# Patient Record
Sex: Male | Born: 1960
Health system: Southern US, Community
[De-identification: ages and names within clinical notes are randomized; demographics above are authoritative.]

## PROBLEM LIST (undated history)

## (undated) DIAGNOSIS — K565 Intestinal adhesions [bands], unspecified as to partial versus complete obstruction: Secondary | ICD-10-CM

## (undated) DIAGNOSIS — I10 Essential (primary) hypertension: Secondary | ICD-10-CM

## (undated) HISTORY — PX: HERNIA REPAIR: SHX51

## (undated) HISTORY — DX: Intestinal adhesions (bands), unspecified as to partial versus complete obstruction: K56.50

---

## 2006-04-26 ENCOUNTER — Encounter: Admission: RE | Admit: 2006-04-26 | Discharge: 2006-04-26 | Payer: Self-pay | Admitting: Surgery

## 2006-04-28 ENCOUNTER — Ambulatory Visit (HOSPITAL_BASED_OUTPATIENT_CLINIC_OR_DEPARTMENT_OTHER): Admission: RE | Admit: 2006-04-28 | Discharge: 2006-04-28 | Payer: Self-pay | Admitting: Surgery

## 2006-05-05 ENCOUNTER — Encounter: Admission: RE | Admit: 2006-05-05 | Discharge: 2006-05-05 | Payer: Self-pay | Admitting: Surgery

## 2011-12-08 ENCOUNTER — Encounter: Payer: Self-pay | Attending: "Endocrinology | Admitting: *Deleted

## 2011-12-08 DIAGNOSIS — Z713 Dietary counseling and surveillance: Secondary | ICD-10-CM

## 2011-12-14 NOTE — Progress Notes (Signed)
Patient attended the [facility]: Hypertension/High Cholesterol nutrition class on 12/08/11.  Topics covered include:   1. Complications of Hyperlipidemia and/or Hypertension. 2. Ways to reduce risk of heart disease.  3. Identifying fat and sodium content on food labels. 4. Ways to decrease sodium intake. 5. Optimal amount of daily saturated fat intake. 6. Optimal amount of daily sodium intake.  7. Foods to limit/avoid on a heart healthy diet.  Patient to follow-up with [facility] prn.  

## 2015-09-12 MED FILL — BYSTOLIC 5 MG TABLET: 5 | 60 days supply | Qty: 60 | Fill #2

## 2015-12-01 MED FILL — ROSUVASTATIN CALCIUM 10 MG: 10 | 30 days supply | Qty: 30 | Fill #1

## 2015-12-01 MED FILL — BYSTOLIC 5 MG TABLET: 5 | 90 days supply | Qty: 90 | Fill #0

## 2016-04-07 DIAGNOSIS — H52203 Unspecified astigmatism, bilateral: Secondary | ICD-10-CM | POA: Diagnosis not present

## 2016-09-13 DIAGNOSIS — Z79899 Other long term (current) drug therapy: Secondary | ICD-10-CM | POA: Diagnosis not present

## 2016-09-13 DIAGNOSIS — Z6837 Body mass index (BMI) 37.0-37.9, adult: Secondary | ICD-10-CM | POA: Diagnosis not present

## 2016-09-13 DIAGNOSIS — E785 Hyperlipidemia, unspecified: Secondary | ICD-10-CM | POA: Diagnosis not present

## 2016-09-13 DIAGNOSIS — J302 Other seasonal allergic rhinitis: Secondary | ICD-10-CM | POA: Diagnosis not present

## 2016-09-13 DIAGNOSIS — Z125 Encounter for screening for malignant neoplasm of prostate: Secondary | ICD-10-CM | POA: Diagnosis not present

## 2016-09-13 DIAGNOSIS — I10 Essential (primary) hypertension: Secondary | ICD-10-CM | POA: Diagnosis not present

## 2016-09-13 MED FILL — BYSTOLIC 5 MG TABLET: 5 | 90 days supply | Qty: 90 | Fill #0

## 2016-09-14 MED FILL — ROSUVASTATIN CALCIUM 40 MG: 40 | 90 days supply | Qty: 90 | Fill #0

## 2017-03-15 DIAGNOSIS — Z6837 Body mass index (BMI) 37.0-37.9, adult: Secondary | ICD-10-CM | POA: Diagnosis not present

## 2017-03-15 DIAGNOSIS — Z131 Encounter for screening for diabetes mellitus: Secondary | ICD-10-CM | POA: Diagnosis not present

## 2017-03-15 DIAGNOSIS — Z1329 Encounter for screening for other suspected endocrine disorder: Secondary | ICD-10-CM | POA: Diagnosis not present

## 2017-03-15 DIAGNOSIS — I10 Essential (primary) hypertension: Secondary | ICD-10-CM | POA: Diagnosis not present

## 2017-03-15 DIAGNOSIS — Z125 Encounter for screening for malignant neoplasm of prostate: Secondary | ICD-10-CM | POA: Diagnosis not present

## 2017-03-15 DIAGNOSIS — Z1331 Encounter for screening for depression: Secondary | ICD-10-CM | POA: Diagnosis not present

## 2017-03-15 DIAGNOSIS — Z0000 Encounter for general adult medical examination without abnormal findings: Secondary | ICD-10-CM | POA: Diagnosis not present

## 2017-03-15 DIAGNOSIS — E785 Hyperlipidemia, unspecified: Secondary | ICD-10-CM | POA: Diagnosis not present

## 2017-03-15 DIAGNOSIS — Z79899 Other long term (current) drug therapy: Secondary | ICD-10-CM | POA: Diagnosis not present

## 2017-03-15 MED FILL — ROSUVASTATIN CALCIUM 40 MG: 40 | 90 days supply | Qty: 90 | Fill #0

## 2017-03-21 MED FILL — BYSTOLIC 5 MG TABLET: 5 | 90 days supply | Qty: 90 | Fill #0

## 2017-03-22 MED FILL — COMBIVENT RESPIMAT INHAL SP: 20-100 | 30 days supply | Qty: 4 | Fill #0

## 2017-11-10 ENCOUNTER — Ambulatory Visit (HOSPITAL_COMMUNITY)
Admission: RE | Admit: 2017-11-10 | Discharge: 2017-11-10 | Disposition: A | Discharge status: Home / Self Care | Payer: 59 | Source: Ambulatory Visit | Attending: Internal Medicine | Admitting: Internal Medicine

## 2017-11-10 ENCOUNTER — Encounter (HOSPITAL_COMMUNITY): Payer: Self-pay

## 2017-11-10 ENCOUNTER — Other Ambulatory Visit (HOSPITAL_COMMUNITY): Payer: Self-pay | Admitting: Internal Medicine

## 2017-11-10 ENCOUNTER — Other Ambulatory Visit (HOSPITAL_COMMUNITY): Payer: Self-pay | Admitting: Respiratory Therapy

## 2017-11-10 DIAGNOSIS — J45909 Unspecified asthma, uncomplicated: Secondary | ICD-10-CM | POA: Diagnosis not present

## 2017-11-10 DIAGNOSIS — K760 Fatty (change of) liver, not elsewhere classified: Secondary | ICD-10-CM | POA: Insufficient documentation

## 2017-11-10 DIAGNOSIS — Z6836 Body mass index (BMI) 36.0-36.9, adult: Secondary | ICD-10-CM | POA: Diagnosis not present

## 2017-11-10 DIAGNOSIS — K769 Liver disease, unspecified: Secondary | ICD-10-CM

## 2017-11-10 DIAGNOSIS — K56600 Partial intestinal obstruction, unspecified as to cause: Secondary | ICD-10-CM

## 2017-11-10 DIAGNOSIS — K5900 Constipation, unspecified: Secondary | ICD-10-CM

## 2017-11-10 DIAGNOSIS — K56609 Unspecified intestinal obstruction, unspecified as to partial versus complete obstruction: Secondary | ICD-10-CM

## 2017-11-10 DIAGNOSIS — R109 Unspecified abdominal pain: Secondary | ICD-10-CM

## 2017-11-10 DIAGNOSIS — I10 Essential (primary) hypertension: Secondary | ICD-10-CM | POA: Diagnosis not present

## 2017-11-10 DIAGNOSIS — Z79899 Other long term (current) drug therapy: Secondary | ICD-10-CM

## 2017-11-10 DIAGNOSIS — K56699 Other intestinal obstruction unspecified as to partial versus complete obstruction: Secondary | ICD-10-CM | POA: Diagnosis not present

## 2017-11-10 DIAGNOSIS — E785 Hyperlipidemia, unspecified: Secondary | ICD-10-CM | POA: Diagnosis not present

## 2017-11-10 DIAGNOSIS — I700 Atherosclerosis of aorta: Secondary | ICD-10-CM | POA: Insufficient documentation

## 2017-11-10 DIAGNOSIS — R112 Nausea with vomiting, unspecified: Secondary | ICD-10-CM | POA: Diagnosis not present

## 2017-11-10 DIAGNOSIS — K567 Ileus, unspecified: Secondary | ICD-10-CM | POA: Diagnosis not present

## 2017-11-10 MED ORDER — IOPAMIDOL (ISOVUE-300) INJECTION 61%
INTRAVENOUS | Status: AC
  Administered 2017-11-10: 100 mL
  Filled 2017-11-10: qty 30

## 2017-11-11 ENCOUNTER — Ambulatory Visit (HOSPITAL_COMMUNITY): Payer: 59

## 2017-11-12 ENCOUNTER — Inpatient Hospital Stay (HOSPITAL_COMMUNITY): Payer: 59

## 2017-11-12 ENCOUNTER — Ambulatory Visit (HOSPITAL_COMMUNITY)
Admission: RE | Admit: 2017-11-12 | Discharge: 2017-11-12 | Disposition: A | Discharge status: Home / Self Care | Payer: 59 | Source: Ambulatory Visit | Attending: Internal Medicine | Admitting: Internal Medicine

## 2017-11-12 ENCOUNTER — Other Ambulatory Visit: Payer: Self-pay

## 2017-11-12 ENCOUNTER — Encounter (HOSPITAL_COMMUNITY): Payer: Self-pay | Admitting: Emergency Medicine

## 2017-11-12 ENCOUNTER — Inpatient Hospital Stay (HOSPITAL_COMMUNITY)
Admission: EM | Admit: 2017-11-12 | Discharge: 2017-11-15 | DRG: 390 | Disposition: A | Discharge status: Home / Self Care | Payer: 59 | Attending: General Surgery | Admitting: General Surgery

## 2017-11-12 DIAGNOSIS — K56609 Unspecified intestinal obstruction, unspecified as to partial versus complete obstruction: Secondary | ICD-10-CM | POA: Diagnosis not present

## 2017-11-12 DIAGNOSIS — K769 Liver disease, unspecified: Secondary | ICD-10-CM

## 2017-11-12 DIAGNOSIS — I10 Essential (primary) hypertension: Secondary | ICD-10-CM | POA: Diagnosis present

## 2017-11-12 DIAGNOSIS — Z0189 Encounter for other specified special examinations: Secondary | ICD-10-CM

## 2017-11-12 DIAGNOSIS — Z4682 Encounter for fitting and adjustment of non-vascular catheter: Secondary | ICD-10-CM | POA: Diagnosis not present

## 2017-11-12 DIAGNOSIS — K760 Fatty (change of) liver, not elsewhere classified: Secondary | ICD-10-CM

## 2017-11-12 DIAGNOSIS — R112 Nausea with vomiting, unspecified: Secondary | ICD-10-CM | POA: Diagnosis not present

## 2017-11-12 DIAGNOSIS — K5651 Intestinal adhesions [bands], with partial obstruction: Secondary | ICD-10-CM | POA: Diagnosis not present

## 2017-11-12 DIAGNOSIS — Z79899 Other long term (current) drug therapy: Secondary | ICD-10-CM | POA: Diagnosis not present

## 2017-11-12 DIAGNOSIS — K565 Intestinal adhesions [bands], unspecified as to partial versus complete obstruction: Secondary | ICD-10-CM

## 2017-11-12 DIAGNOSIS — K56699 Other intestinal obstruction unspecified as to partial versus complete obstruction: Secondary | ICD-10-CM | POA: Diagnosis not present

## 2017-11-12 DIAGNOSIS — K56600 Partial intestinal obstruction, unspecified as to cause: Secondary | ICD-10-CM | POA: Diagnosis present

## 2017-11-12 HISTORY — DX: Essential (primary) hypertension: I10

## 2017-11-12 HISTORY — DX: Intestinal adhesions (bands), unspecified as to partial versus complete obstruction: K56.50

## 2017-11-12 LAB — COMPREHENSIVE METABOLIC PANEL
ALT: 29 U/L (ref 0–44)
AST: 28 U/L (ref 15–41)
Albumin: 4 g/dL (ref 3.5–5.0)
Alkaline Phosphatase: 68 U/L (ref 38–126)
Anion gap: 12 (ref 5–15)
BUN: 13 mg/dL (ref 6–20)
CHLORIDE: 98 mmol/L (ref 98–111)
CO2: 23 mmol/L (ref 22–32)
CREATININE: 1.12 mg/dL (ref 0.61–1.24)
Calcium: 9.2 mg/dL (ref 8.9–10.3)
GFR calc non Af Amer: >60 mL/min (ref >60)
Glucose, Bld: 111 mg/dL — ABNORMAL HIGH (ref 70–99)
Potassium: 3.8 mmol/L (ref 3.5–5.1)
Sodium: 133 mmol/L — ABNORMAL LOW (ref 135–145)
Total Bilirubin: 1.4 mg/dL — ABNORMAL HIGH (ref 0.3–1.2)
Total Protein: 7.1 g/dL (ref 6.5–8.1)

## 2017-11-12 LAB — CBC
HCT: 44.1 % (ref 39.0–52.0)
Hemoglobin: 14.7 g/dL (ref 13.0–17.0)
MCH: 29.6 pg (ref 26.0–34.0)
MCHC: 33.3 g/dL (ref 30.0–36.0)
MCV: 88.9 fL (ref 78.0–100.0)
PLATELETS: 260 10*3/uL (ref 150–400)
RBC: 4.96 MIL/uL (ref 4.22–5.81)
RDW: 11.9 % (ref 11.5–15.5)
WBC: 5.4 10*3/uL (ref 4.0–10.5)

## 2017-11-12 LAB — LIPASE, BLOOD: LIPASE: 23 U/L (ref 11–51)

## 2017-11-12 MED ORDER — ACETAMINOPHEN 650 MG RE SUPP: 650.0000 mg | Freq: Four times a day (QID) | RECTAL | Status: DC | PRN

## 2017-11-12 MED ORDER — GADOBENATE DIMEGLUMINE 529 MG/ML IV SOLN
20.0000 mL | Freq: Once | INTRAVENOUS | Status: AC
  Administered 2017-11-12: 20 mL via INTRAVENOUS

## 2017-11-12 MED ORDER — ONDANSETRON 4 MG PO TBDP
4.0000 mg | ORAL_TABLET | Freq: Four times a day (QID) | ORAL | Status: DC | PRN
Start: 2017-11-12 — End: 2017-11-15

## 2017-11-12 MED ORDER — MORPHINE SULFATE (PF) 2 MG/ML IV SOLN
2.0000 mg | INTRAVENOUS | Status: DC | PRN
  Administered 2017-11-12 – 2017-11-14 (×7): 2 mg via INTRAVENOUS
  Filled 2017-11-12 (×7): qty 1

## 2017-11-12 MED ORDER — ENOXAPARIN SODIUM 40 MG/0.4ML ~~LOC~~ SOLN
40.0000 mg | SUBCUTANEOUS | Status: DC
  Administered 2017-11-12 – 2017-11-14 (×3): 40 mg via SUBCUTANEOUS
  Filled 2017-11-12 (×3): qty 0.4

## 2017-11-12 MED ORDER — DIPHENHYDRAMINE HCL 50 MG/ML IJ SOLN: 12.5000 mg | Freq: Four times a day (QID) | INTRAMUSCULAR | Status: DC | PRN

## 2017-11-12 MED ORDER — METOPROLOL TARTRATE 5 MG/5ML IV SOLN: 5.0000 mg | Freq: Four times a day (QID) | INTRAVENOUS | Status: DC | PRN

## 2017-11-12 MED ORDER — ALUM & MAG HYDROXIDE-SIMETH 200-200-20 MG/5ML PO SUSP
15.0000 mL | Freq: Four times a day (QID) | ORAL | Status: DC | PRN
  Administered 2017-11-12 – 2017-11-14 (×2): 15 mL via ORAL
  Filled 2017-11-12 (×2): qty 30

## 2017-11-12 MED ORDER — SODIUM CHLORIDE 0.9 % IV SOLN
INTRAVENOUS | Status: DC
  Administered 2017-11-12 – 2017-11-15 (×5): via INTRAVENOUS

## 2017-11-12 MED ORDER — ONDANSETRON HCL 4 MG/2ML IJ SOLN
4.0000 mg | Freq: Four times a day (QID) | INTRAMUSCULAR | Status: DC | PRN
  Administered 2017-11-12: 4 mg via INTRAVENOUS
  Filled 2017-11-12: qty 2

## 2017-11-12 MED ORDER — DIPHENHYDRAMINE HCL 12.5 MG/5ML PO ELIX: 12.5000 mg | ORAL_SOLUTION | Freq: Four times a day (QID) | ORAL | Status: DC | PRN

## 2017-11-12 MED ORDER — ACETAMINOPHEN 325 MG PO TABS: 650.0000 mg | ORAL_TABLET | Freq: Four times a day (QID) | ORAL | Status: DC | PRN

## 2017-11-12 MED ORDER — DIATRIZOATE MEGLUMINE & SODIUM 66-10 % PO SOLN
90.0000 mL | Freq: Once | ORAL | Status: AC
  Administered 2017-11-12: 90 mL via NASOGASTRIC
  Filled 2017-11-12: qty 90

## 2017-11-12 NOTE — ED Provider Notes (Signed)
[facility] EMERGENCY DEPARTMENT Provider Note   CSN: [id number] Arrival date & time: 11/12/17  1057     History   Chief Complaint Chief Complaint  Patient presents with  . Small Bowel Obstruction    HPI Edward Meadows is a 57 y.o. male.  HPI Patient presents to the emergency department with right-sided abdominal pain that started on Sunday evening and progressively got worse over the next few days.  The patient states that he was seen by his primary care doctor on Wednesday for the symptoms.  He stated he been having nausea diarrhea and vomiting associated with this as well.  The patient thought he had a GI bug and that is what caused his symptoms.  Patient states his doctor did order blood work along with a CT scan and ultrasound.  The patient states that nothing seemed to make his condition better but certain movements and palpation made the pain worse.  Patient states that he did not take any medications prior to arrival for his symptoms.  The patient denies chest pain, shortness of breath, headache,blurred vision, neck pain, fever, cough, weakness, numbness, dizziness, anorexia, edema,  rash, back pain, dysuria, hematemesis, bloody stool, near syncope, or syncope. Past Medical History:  Diagnosis Date  . Hypertension     Patient Active Problem List   Diagnosis Date Noted  . SBO (small bowel obstruction) (HCC) 11/12/2017    Past Surgical History:  Procedure Laterality Date  . HERNIA REPAIR          Home Medications    Prior to Admission medications   Medication Sig Start Date End Date Taking? Authorizing Provider  BYSTOLIC 5 MG tablet Take 5 mg by mouth daily. 11/10/17  Yes [provider]  COMBIVENT RESPIMAT 20-100 MCG/ACT AERS respimat Take 1 puff by mouth every 6 (six) hours as needed for wheezing or shortness of breath. 11/10/17  Yes [provider]  rosuvastatin (CRESTOR) 40 MG tablet Take 40 mg by mouth daily. 11/10/17  Yes  [provider]    Family History History reviewed. No pertinent family history.  Social History Social History   Tobacco Use  . Smoking status: Never Smoker  . Smokeless tobacco: Never Used  Substance Use Topics  . Alcohol use: Not Currently  . Drug use: Never     Allergies   Patient has no known allergies.   Review of Systems Review of Systems  All other systems negative except as documented in the HPI. All pertinent positives and negatives as reviewed in the HPI. Physical Exam Updated Vital Signs BP 120/71   Pulse 79   Temp 97.7 F (36.5 C) (Oral)   Resp 14   SpO2 99%   Physical Exam  Constitutional: He is oriented to person, place, and time. He appears well-developed and well-nourished. No distress.  HENT:  Head: Normocephalic and atraumatic.  Mouth/Throat: Oropharynx is clear and moist.  Eyes: Pupils are equal, round, and reactive to light.  Neck: Normal range of motion. Neck supple.  Cardiovascular: Normal rate, regular rhythm and normal heart sounds. Exam reveals no gallop and no friction rub.  No murmur heard. Pulmonary/Chest: Effort normal and breath sounds normal. No respiratory distress. He has no wheezes.  Abdominal: Soft. Bowel sounds are normal. He exhibits no distension and no mass. There is tenderness. There is guarding. There is no rebound.    Neurological: He is alert and oriented to person, place, and time. He exhibits normal muscle tone. Coordination normal.  Skin: Skin is warm and dry. Capillary refill takes less than 2 seconds. No rash noted. No erythema.  Psychiatric: He has a normal mood and affect. His behavior is normal.  Nursing note and vitals reviewed.    ED Treatments / Results  Labs (all labs ordered are listed, but only abnormal results are displayed) Labs Reviewed  COMPREHENSIVE METABOLIC PANEL - Abnormal; Notable for the following components:      Result Value   Sodium 133 (*)    Glucose, Bld 111 (*)    Total  Bilirubin 1.4 (*)    All other components within normal limits  LIPASE, BLOOD  CBC    EKG None  Radiology Ct Abdomen Pelvis W Contrast  Result Date: 11/10/2017 CLINICAL DATA:  Abdominal pain, nausea and vomiting. Small bowel obstruction on radiographs. EXAM: CT ABDOMEN AND PELVIS WITH CONTRAST TECHNIQUE: Multidetector CT imaging of the abdomen and pelvis was performed using the standard protocol following bolus administration of intravenous contrast. CONTRAST:  100 cc ISOVUE-300 IOPAMIDOL (ISOVUE-300) INJECTION 61% COMPARISON:  11/10/2017 abdominal radiograph FINDINGS: Lower chest: No significant pulmonary nodules or acute consolidative airspace disease. Hepatobiliary: Normal liver size. No liver mass. Normal gallbladder with no radiopaque cholelithiasis. No biliary ductal dilatation. Pancreas: Normal, with no mass or duct dilation. Spleen: Normal size. No mass. Adrenals/Urinary Tract: Normal adrenals. Normal kidneys with no hydronephrosis and no renal mass. Normal bladder. Stomach/Bowel: Normal non-distended stomach. There are multiple mildly to moderately dilated small bowel loops throughout the abdomen measuring up to 4.4 cm diameter, with associated air-fluid levels. Oral contrast transits to the mid to distal small bowel. There is a gradual caliber transition in the right lower quadrant (series 3/image 66). No discrete obstructing mass or definite small bowel wall thickening. No significant small bowel mesentery edema. Normal appendix. Minimal sigmoid diverticulosis. No large bowel wall thickening or significant pericolonic fat stranding. Vascular/Lymphatic: Atherosclerotic nonaneurysmal abdominal aorta. Patent portal, splenic, hepatic and renal veins. No pathologically enlarged lymph nodes in the abdomen or pelvis. Reproductive: Top-normal size prostate. Other: No pneumoperitoneum, ascites or focal fluid collection. Musculoskeletal: No aggressive appearing focal osseous lesions. Mild thoracolumbar  spondylosis. IMPRESSION: 1. CT findings are compatible with a moderate mechanical distal small-bowel obstruction with gradual caliber transition in the right lower quadrant. No obstructing mass. No appreciable hernia. No definite small bowel wall thickening, pneumatosis or significant mesenteric edema to suggest ischemia. No perforation or abscess. 2.  Aortic Atherosclerosis (ICD10-I70.0). These results will be called to the ordering clinician or representative by the Radiology Department at the imaging location. Electronically Signed   By: Delbert Phenix M.D.   On: 11/10/2017 18:20    Procedures Procedures (including critical care time)  Medications Ordered in ED Medications - No data to display   Initial Impression / Assessment and Plan / ED Course  I have reviewed the triage vital signs and the nursing notes.  Pertinent labs & imaging results that were available during my care of the patient were reviewed by me and considered in my medical decision making (see chart for details).     I spoke with Dr. Dwain Sarna to general surgery who will admit the patient for further evaluation and care.  I did order an NG tube to be placed as well.  The patient is advised of the need for admission to the small bowel obstruction as noted on the CT scan that was ordered this past Wednesday.  Patient's main issue is continued with pain and nausea and did have several episodes of  vomiting and MRI today.  Final Clinical Impressions(s) / ED Diagnoses   Final diagnoses:  SBO (small bowel obstruction) [facility](HCC)    ED Discharge Orders    None       Charlestine NightLawyer, Kiandra Sanguinetti, PA-C 11/12/17 1528    Linwood DibblesKnapp, Jon, MD 11/12/17 1630

## 2017-11-12 NOTE — H&P (Signed)
Edward Meadows is an 57 y.o. male.   Chief Complaint: abd pain, n/v HPI: 32 yom with prior history uh repair with mesh has about 5 days of abd pain, n/v, unable to take po.  He was seen by PCP and had xrays, [place] and ct scan.  US showed possible liver lesion not seen on ct.  Ct showed sbo. He continues to have same symptoms and not getting better. Very little flatus and small bm today. Feels very bloated. Nothing helping at home.  He was getting an mri today for liver and he was brought to er.  He has never had a csc.    Past Medical History:  Diagnosis Date  . Hypertension     Past Surgical History:  Procedure Laterality Date  . HERNIA REPAIR      History reviewed. No pertinent family history. Social History:  reports that he has never smoked. He has never used smokeless tobacco. He reports that he drank alcohol. He reports that he does not use drugs.  Allergies: Not on File  meds bystolic  Results for orders placed or performed during the hospital encounter of 11/12/17 (from the past 48 hour(s))  Lipase, blood     Status: None   Collection Time: 11/12/17 11:19 AM  Result Value Ref Range   Lipase 23 11 - 51 U/L    Comment: Performed at [facility], [place] [address] [place], [place] [address]  Comprehensive metabolic panel     Status: Abnormal   Collection Time: 11/12/17 11:19 AM  Result Value Ref Range   Sodium 133 (L) 135 - 145 mmol/L   Potassium 3.8 3.5 - 5.1 mmol/L   Chloride 98 98 - 111 mmol/L    Comment: Please note change in reference range.   CO2 23 22 - 32 mmol/L   Glucose, Bld 111 (H) 70 - 99 mg/dL    Comment: Please note change in reference range.   BUN 13 6 - 20 mg/dL    Comment: Please note change in reference range.   Creatinine, Ser 1.12 0.61 - 1.24 mg/dL   Calcium 9.2 8.9 - 10.3 mg/dL   Total Protein 7.1 6.5 - 8.1 g/dL   Albumin 4.0 3.5 - 5.0 g/dL   AST 28 15 - 41 U/L   ALT 29 0 - 44 U/L    Comment: Please note change in reference range.   Alkaline  Phosphatase 68 38 - 126 U/L   Total Bilirubin 1.4 (H) 0.3 - 1.2 mg/dL   GFR calc non Af Amer >60 >60 mL/min   GFR calc Af Amer >60 >60 mL/min    Comment: (NOTE) The eGFR has been calculated using the CKD EPI equation. This calculation has not been validated in all clinical situations. eGFR's persistently <60 mL/min signify possible Chronic Kidney Disease.    Anion gap 12 5 - 15    Comment: Performed at [facility] [address] [place] [address]  CBC     Status: None   Collection Time: 11/12/17 11:19 AM  Result Value Ref Range   WBC 5.4 4.0 - 10.5 K/uL   RBC 4.96 4.22 - 5.81 MIL/uL   Hemoglobin 14.7 13.0 - 17.0 g/dL   HCT 44.1 39.0 - 52.0 %   MCV 88.9 78.0 - 100.0 fL   MCH 29.6 26.0 - 34.0 pg   MCHC 33.3 30.0 - 36.0 g/dL   RDW 11.9 11.5 - 15.5 %   Platelets 260 150 -  400 K/uL    Comment: Performed at [facility], [place] [address], [place], [place] [address]   Ct Abdomen Pelvis W Contrast  Result Date: 11/10/2017 CLINICAL DATA:  Abdominal pain, nausea and vomiting. Small bowel obstruction on radiographs. EXAM: CT ABDOMEN AND PELVIS WITH CONTRAST TECHNIQUE: Multidetector CT imaging of the abdomen and pelvis was performed using the standard protocol following bolus administration of intravenous contrast. CONTRAST:  100 cc ISOVUE-300 IOPAMIDOL (ISOVUE-300) INJECTION 61% COMPARISON:  11/10/2017 abdominal radiograph FINDINGS: Lower chest: No significant pulmonary nodules or acute consolidative airspace disease. Hepatobiliary: Normal liver size. No liver mass. Normal gallbladder with no radiopaque cholelithiasis. No biliary ductal dilatation. Pancreas: Normal, with no mass or duct dilation. Spleen: Normal size. No mass. Adrenals/Urinary Tract: Normal adrenals. Normal kidneys with no hydronephrosis and no renal mass. Normal bladder. Stomach/Bowel: Normal non-distended stomach. There are multiple mildly to moderately dilated small bowel loops throughout the abdomen  measuring up to 4.4 cm diameter, with associated air-fluid levels. Oral contrast transits to the mid to distal small bowel. There is a gradual caliber transition in the right lower quadrant (series 3/image 66). No discrete obstructing mass or definite small bowel wall thickening. No significant small bowel mesentery edema. Normal appendix. Minimal sigmoid diverticulosis. No large bowel wall thickening or significant pericolonic fat stranding. Vascular/Lymphatic: Atherosclerotic nonaneurysmal abdominal aorta. Patent portal, splenic, hepatic and renal veins. No pathologically enlarged lymph nodes in the abdomen or pelvis. Reproductive: Top-normal size prostate. Other: No pneumoperitoneum, ascites or focal fluid collection. Musculoskeletal: No aggressive appearing focal osseous lesions. Mild thoracolumbar spondylosis. IMPRESSION: 1. CT findings are compatible with a moderate mechanical distal small-bowel obstruction with gradual caliber transition in the right lower quadrant. No obstructing mass. No appreciable hernia. No definite small bowel wall thickening, pneumatosis or significant mesenteric edema to suggest ischemia. No perforation or abscess. 2.  Aortic Atherosclerosis (ICD10-I70.0). These results will be called to the ordering clinician or representative by the Radiology Department at the imaging location. Electronically Signed   By: Ilona Sorrel M.D.   On: 11/10/2017 18:20    Review of Systems  Gastrointestinal: Positive for abdominal pain, constipation, nausea and vomiting.  All other systems reviewed and are negative.   Blood pressure 120/71, pulse 79, temperature 97.7 F (36.5 C), temperature source Oral, resp. rate 14, SpO2 99 %. Physical Exam  Vitals reviewed. Constitutional: He is oriented to person, place, and time. He appears well-developed and well-nourished.  HENT:  Head: Normocephalic and atraumatic.  Eyes: No scleral icterus.  Neck: Neck supple.  Cardiovascular: Normal rate,  regular rhythm, normal heart sounds and intact distal pulses.  Respiratory: Effort normal and breath sounds normal. He has no wheezes.  GI: He exhibits distension. Bowel sounds are decreased. There is no tenderness. No hernia.    Lymphadenopathy:    He has no cervical adenopathy.  Neurological: He is alert and oriented to person, place, and time.  Skin: Skin is warm and dry. He is not diaphoretic.  Psychiatric: He has a normal mood and affect. His behavior is normal.     Assessment/Plan SBO, likely adhesive  -admission, NPO, iv fluids -SBO protocol, place ng tube -follow up liver mri -lovenox, scds -discussed likely resolution conservatively but if fails or gets worse may need surgery -will need csc when this resolves  Rolm Bookbinder, MD 11/12/2017, 2:50 PM

## 2017-11-12 NOTE — ED Triage Notes (Signed)
Pt presents to ED for assessment after being diagnosed with a SBO on Thursday.  Also states he came in today for an MRI of a lesion found on his liver.  Patient states symptoms are not improving, and PCP recommending assessment by a surgeon and fluid resuscitation.  Patient denies pain or nausea at this time.  Pt comes from MRI with an IV and in a gown.

## 2017-11-13 ENCOUNTER — Other Ambulatory Visit: Payer: Self-pay

## 2017-11-13 ENCOUNTER — Inpatient Hospital Stay (HOSPITAL_COMMUNITY): Payer: 59

## 2017-11-13 LAB — BASIC METABOLIC PANEL
Anion gap: 13 (ref 5–15)
BUN: 13 mg/dL (ref 6–20)
CO2: 23 mmol/L (ref 22–32)
Calcium: 8.6 mg/dL — ABNORMAL LOW (ref 8.9–10.3)
Chloride: 103 mmol/L (ref 98–111)
Creatinine, Ser: 1.01 mg/dL (ref 0.61–1.24)
GFR calc non Af Amer: >60 mL/min (ref >60)
GLUCOSE: 105 mg/dL — AB (ref 70–99)
POTASSIUM: 3.8 mmol/L (ref 3.5–5.1)
Sodium: 139 mmol/L (ref 135–145)

## 2017-11-13 LAB — CBC
HEMATOCRIT: 41.2 % (ref 39.0–52.0)
HEMOGLOBIN: 13.8 g/dL (ref 13.0–17.0)
MCH: 29.9 pg (ref 26.0–34.0)
MCHC: 33.5 g/dL (ref 30.0–36.0)
MCV: 89.4 fL (ref 78.0–100.0)
Platelets: 263 10*3/uL (ref 150–400)
RBC: 4.61 MIL/uL (ref 4.22–5.81)
RDW: 12.1 % (ref 11.5–15.5)
WBC: 6.6 10*3/uL (ref 4.0–10.5)

## 2017-11-13 NOTE — Progress Notes (Signed)
Subjective: Complains of NG tube discomfort. Abdominal pain significantly better Passed some flatus but no stool  8 hour films show moderate amount of gas and contrast throughout nondistended colon.  However there is still some distended small bowel.  Objective: Vital signs in last 24 hours: Temp:  [97.7 F (36.5 C)-98.9 F (37.2 C)] 98.1 F (36.7 C) (06/30 0410) Pulse Rate:  [79-99] 98 (06/30 0410) Resp:  [14-20] 18 (06/30 0410) BP: (116-163)/(71-105) 143/96 (06/30 0410) SpO2:  [96 %-100 %] 98 % (06/30 0410) Last BM Date: 11/12/17  Intake/Output from previous day: 06/29 0701 - 06/30 0700 In: 1393.4 [I.V.:1393.4] Out: 1075 [Urine:375; Emesis/NG output:700] Intake/Output this shift: No intake/output data recorded.  EXAM: General: Alert.  Does not appear in any distress.  Alert and cooperative Lungs: Clear to auscultation Abdomen: Mild distention.  Hypoactive bowel sounds.  Nontender.  Well-healed transverse incision below umbilicus.  No abdominal wall hernias noted. Extremities.  No tenderness or edema.  Lab Results:  Recent Labs    11/12/17 1119  WBC 5.4  HGB 14.7  HCT 44.1  PLT 260   BMET Recent Labs    11/12/17 1119  NA 133*  K 3.8  CL 98  CO2 23  GLUCOSE 111*  BUN 13  CREATININE 1.12  CALCIUM 9.2   PT/INR No results for input(s): LABPROT, INR in the last 72 hours. ABG No results for input(s): PHART, HCO3 in the last 72 hours.  Invalid input(s): PCO2, PO2  Studies/Results: Dg Abd Portable 1v-small Bowel Obstruction Protocol-initial, 8 Hr Delay  Result Date: 11/13/2017 CLINICAL DATA:  Small-bowel obstruction, 8 hour delay radiographs. EXAM: PORTABLE ABDOMEN - 1 VIEW COMPARISON:  11/12/2017 FINDINGS: Gaseous distention of large bowel to the level of the rectum with enteric contrast noted in the distal transverse through descending colon as well as in the rectum. Scattered colonic diverticulosis is noted. A few gas containing mildly distended small  bowel loops are seen centrally. The the degree of small bowel involvement has decreased. Findings may reflect partial SBO. No free air. IMPRESSION: There has been some interval decompression of dilated small bowel though still present. Nonobstructed, nondistended large bowel with enteric contrast is seen. Findings suggest a partial SBO. Electronically Signed   By: Tollie Eth M.D.   On: 11/13/2017 03:25   Dg Abd Portable 1v-small Bowel Protocol-position Verification  Result Date: 11/12/2017 CLINICAL DATA:  Encounter for gastric tube placement EXAM: PORTABLE ABDOMEN - 1 VIEW COMPARISON:  None. FINDINGS: The tip of a gastric tube is seen below the GE juncture however the side-port remains approximately 3.6 cm above the GE junction and further advancement of the tube is recommended by at the 6-7 cm. Heart is top-normal in size. Contrast is noted within nonobstructed, nondistended large bowel with dilated loops of small bowel present within the abdomen. No free air seen. IMPRESSION: 1. Further advancement of the patient's gastric tube by at least 6-7 cm is recommended as the side-port is approximately 3.6 cm above the expected location of the GE junction. 2. Abnormal bowel gas pattern with numerous dilated gas-filled small bowel loops out of proportion to contrast filled nondistended large bowel. Findings would be in keeping with small-bowel obstruction potentially a high-grade partial SBO given presence of enteric contrast within large bowel. Electronically Signed   By: Tollie Eth M.D.   On: 11/12/2017 18:52    Anti-infectives: Anti-infectives (From admission, onward)   None      Assessment/Plan:  Partial SBO.  No evidence of bowel compromise.  Likely adhesions -Continue NG -Lab and x-ray tomorrow hopefully this will resolve with medical management.  Patient aware that if it does not he may require operative intervention -But hopefully not. -Lovenox and SCDs.  History open umbilical hernia repair  with mesh.  Dr. Gerrit FriendsGerkin.  Appears to have healed well.  Question liver mass.  MRI completed but not read.  Follow-up MRI   LOS: 1 day    Edward Meadows 11/13/2017

## 2017-11-14 NOTE — Progress Notes (Signed)
[facility] Progress Note     Subjective: CC- SBO Patient states that he feels a little better today. Denies any current abdominal pain but does report mild nausea. Passing more flatus than yesterday. He had a small BM this morning. Abdominal bloating improving.  Objective: Vital signs in last 24 hours: Temp:  [97.7 F (36.5 C)-99.1 F (37.3 C)] 98.3 F (36.8 C) (07/01 0542) Pulse Rate:  [93-97] 97 (07/01 0542) Resp:  [18] 18 (07/01 0542) BP: (149-159)/(89-92) 159/92 (07/01 0542) SpO2:  [96 %-98 %] 98 % (07/01 0542) Last BM Date: 11/13/17(very small per patient)  Intake/Output from previous day: 06/30 0701 - 07/01 0700 In: 2324.7 [I.V.:2324.7] Out: 1450 [Emesis/NG output:1450] Intake/Output this shift: No intake/output data recorded.  PE: Gen:  Alert, NAD, pleasant HEENT: EOM's intact, pupils equal and round Card:  RRR, no M/G/R heard Pulm:  CTAB, no W/R/R, effort normal Abd: Soft, NT, +BS, no HSM, no hernia Ext:  Calves soft and nontender Psych: A&Ox3  Skin: no rashes noted, warm and dry  Lab Results:  Recent Labs    11/12/17 1119 11/13/17 0814  WBC 5.4 6.6  HGB 14.7 13.8  HCT 44.1 41.2  PLT 260 263   BMET Recent Labs    11/12/17 1119 11/13/17 0814  NA 133* 139  K 3.8 3.8  CL 98 103  CO2 23 23  GLUCOSE 111* 105*  BUN 13 13  CREATININE 1.12 1.01  CALCIUM 9.2 8.6*   PT/INR No results for input(s): LABPROT, INR in the last 72 hours. CMP     Component Value Date/Time   NA 139 11/13/2017 0814   K 3.8 11/13/2017 0814   CL 103 11/13/2017 0814   CO2 23 11/13/2017 0814   GLUCOSE 105 (H) 11/13/2017 0814   BUN 13 11/13/2017 0814   CREATININE 1.01 11/13/2017 0814   CALCIUM 8.6 (L) 11/13/2017 0814   PROT 7.1 11/12/2017 1119   ALBUMIN 4.0 11/12/2017 1119   AST 28 11/12/2017 1119   ALT 29 11/12/2017 1119   ALKPHOS 68 11/12/2017 1119   BILITOT 1.4 (H) 11/12/2017 1119   GFRNONAA >60 11/13/2017 0814   GFRAA >60 11/13/2017 0814   Lipase      Component Value Date/Time   LIPASE 23 11/12/2017 1119       Studies/Results: Mr Liver W Wo Contrast  Result Date: 11/13/2017 CLINICAL DATA:  Hypoechoic hepatic lesions suggested on ultrasound, for further characterization. EXAM: MRI ABDOMEN WITHOUT AND WITH CONTRAST TECHNIQUE: Multiplanar multisequence MR imaging of the abdomen was performed both before and after the administration of intravenous contrast. CONTRAST:  20mL MULTIHANCE GADOBENATE DIMEGLUMINE 529 MG/ML IV SOLN COMPARISON:  Ultrasound of 11/10/2017.  CT scan from 11/10/2017 FINDINGS: Despite efforts by the technologist and patient, motion artifact is present on today's exam and could not be eliminated. This reduces exam sensitivity and specificity. Lower chest: Unremarkable Hepatobiliary: No appreciable focal lesion corresponds with the finding at ultrasound gallbladder unremarkable. Mild geographic hepatic steatosis. Pancreas:  Unremarkable Spleen:  Unremarkable Adrenals/Urinary Tract: Several tiny hypodense lesions in the kidneys are nonenhancing and statistically likely to be small benign cysts although technically nonspecific due to small size. Adrenal glands normal. Stomach/Bowel: There some mildly dilated loops of mid abdominal small bowel in the upper abdomen. Vascular/Lymphatic:  Aortoiliac atherosclerotic vascular disease. Other:  Trace edema in the small bowel mesentery. Musculoskeletal: Unremarkable IMPRESSION: 1. No liver lesion is identified to correspond with the finding at ultrasound. This may have represented some focal fatty sparing or similar  pseudo lesion. I am skeptical that there is a significant liver lesion given that both CT and MRI were negative. 2. Mild geographic hepatic steatosis. 3. Mildly dilated loops of mid abdominal small bowel with trace edema in the small bowel mesentery. 4.  Aortic Atherosclerosis (ICD10-I70.0). Electronically Signed   By: Gaylyn RongWalter  Liebkemann M.D.   On: 11/13/2017 09:10   Dg Abd  Portable 1v-small Bowel Obstruction Protocol-initial, 8 Hr Delay  Result Date: 11/13/2017 CLINICAL DATA:  Small-bowel obstruction, 8 hour delay radiographs. EXAM: PORTABLE ABDOMEN - 1 VIEW COMPARISON:  11/12/2017 FINDINGS: Gaseous distention of large bowel to the level of the rectum with enteric contrast noted in the distal transverse through descending colon as well as in the rectum. Scattered colonic diverticulosis is noted. A few gas containing mildly distended small bowel loops are seen centrally. The the degree of small bowel involvement has decreased. Findings may reflect partial SBO. No free air. IMPRESSION: There has been some interval decompression of dilated small bowel though still present. Nonobstructed, nondistended large bowel with enteric contrast is seen. Findings suggest a partial SBO. Electronically Signed   By: Tollie Ethavid  Kwon M.D.   On: 11/13/2017 03:25   Dg Abd Portable 1v-small Bowel Protocol-position Verification  Result Date: 11/12/2017 CLINICAL DATA:  Encounter for gastric tube placement EXAM: PORTABLE ABDOMEN - 1 VIEW COMPARISON:  None. FINDINGS: The tip of a gastric tube is seen below the GE juncture however the side-port remains approximately 3.6 cm above the GE junction and further advancement of the tube is recommended by at the 6-7 cm. Heart is top-normal in size. Contrast is noted within nonobstructed, nondistended large bowel with dilated loops of small bowel present within the abdomen. No free air seen. IMPRESSION: 1. Further advancement of the patient's gastric tube by at least 6-7 cm is recommended as the side-port is approximately 3.6 cm above the expected location of the GE junction. 2. Abnormal bowel gas pattern with numerous dilated gas-filled small bowel loops out of proportion to contrast filled nondistended large bowel. Findings would be in keeping with small-bowel obstruction potentially a high-grade partial SBO given presence of enteric contrast within large bowel.  Electronically Signed   By: Tollie Ethavid  Kwon M.D.   On: 11/12/2017 18:52    Anti-infectives: Anti-infectives (From admission, onward)   None       Assessment/Plan Liver lesion - not seen on MRI 6/29 HTN - lopressor PRN until reliably tolerating POs  Partial SBO - prior h/o open umbilical hernia repair with mesh by Dr. Gerrit FriendsGerkin - started on SB protocol Saturday - xray yesterday showed some interval decompression of dilated small bowel though still present. Nonobstructed, nondistended large bowel with enteric contrast is seen. Findings suggest a partial SBO  ID - none FEN - IVF, clamp NG tube and give CLD VTE - SCDs, lovenox Foley - none  Plan - Patient passing more gas and had a small BM this morning, but had a moderate amount out from NG tube over the last 24 hours. Clamp NG tube and allow clear liquids. If tolerating this will d/c tube later today. Encourage ambulation.   LOS: 2 days    Franne FortsBrooke A Ansar Skoda , [facility] 11/14/2017, 8:28 AM Pager: [phone] Consults: [phone] Mon 7:00 am -11:30 AM Tues-Fri 7:00 am-4:30 pm Sat-Sun 7:00 am-11:30 am

## 2017-11-15 NOTE — Progress Notes (Signed)
Patient discharged to home. Verbalizes understanding of all discharge instructions including discharge medications and follow up MD visits.  

## 2017-11-15 NOTE — Discharge Instructions (Signed)
Small Bowel Obstruction °A small bowel obstruction means that something is blocking the small bowel. The small bowel is also called the small intestine. It is the long tube that connects the stomach to the colon. An obstruction will stop food and fluids from passing through the small bowel. Treatment depends on what is causing the problem and how bad the problem is. °Follow these instructions at home: °· Get a lot of rest. °· Follow your diet as told by your doctor. You may need to: °? Only drink clear liquids until you start to get better. °? Avoid solid foods as told by your doctor. °· Take over-the-counter and prescription medicines only as told by your doctor. °· Keep all follow-up visits as told by your doctor. This is important. °Contact a doctor if: °· You have a fever. °· You have chills. °Get help right away if: °· You have pain or cramps that get worse. °· You throw up (vomit) blood. °· You have a feeling of being sick to your stomach (nausea) that does not go away. °· You cannot stop throwing up. °· You cannot drink fluids. °· You feel confused. °· You feel dry or thirsty (dehydrated). °· Your belly gets more bloated. °· You feel weak or you pass out (faint). °This information is not intended to replace advice given to you by your health care provider. Make sure you discuss any questions you have with your health care provider. °Document Released: 06/10/2004 Document Revised: 12/29/2015 Document Reviewed: 06/27/2014 °Elsevier Interactive Patient Education © 2018 Elsevier Inc. ° ° °Soft-Food Meal Plan °A soft-food meal plan includes foods that are safe and easy to swallow. This meal plan typically is used: °· If you are having trouble chewing or swallowing foods. °· As a transition meal plan after only having had liquid meals for a long period. ° °What do I need to know about the soft-food meal plan? °A soft-food meal plan includes tender foods that are soft and easy to chew and swallow. In most cases,  bite-sized pieces of food are easier to swallow. A bite-sized piece is about ½ inch or smaller. Foods in this plan do not need to be ground or pureed. °Foods that are very hard, crunchy, or sticky should be avoided. Also, breads, cereals, yogurts, and desserts with nuts, seeds, or fruits should be avoided. °What foods can I eat? °Grains °Rice and wild rice. Moist bread, dressing, pasta, and noodles. Well-moistened dry or cooked cereals, such as farina (cooked wheat cereal), oatmeal, or grits. Biscuits, breads, muffins, pancakes, and waffles that have been well moistened. °Vegetables °Shredded lettuce. Cooked, tender vegetables, including potatoes without skins. Vegetable juices. Broths or creamed soups made with vegetables that are not stringy or chewy. Strained tomatoes (without seeds). °Fruits °Canned or well-cooked fruits. Soft (ripe), peeled fresh fruits, such as peaches, nectarines, kiwi, cantaloupe, honeydew melon, and watermelon (without seeds). Soft berries with small seeds, such as strawberries. Fruit juices (without pulp). °Meats and Other Protein Sources °Moist, tender, lean beef. Mutton. Lamb. Veal. Chicken. Turkey. Liver. Ham. Fish without bones. Eggs. °Dairy °Milk, milk drinks, and cream. Plain cream cheese and cottage cheese. Plain yogurt. °Sweets/Desserts °Flavored gelatin desserts. Custard. Plain ice cream, frozen yogurt, sherbet, milk shakes, and malts. Plain cakes and cookies. Plain hard candy. °Other °Butter, margarine (without trans fat), and cooking oils. Mayonnaise. Cream sauces. Mild spices, salt, and sugar. Syrup, molasses, honey, and jelly. °The items listed above may not be a complete list of recommended foods or beverages. Contact your   dietitian for more options. °What foods are not recommended? °Grains °Dry bread, toast, crackers that have not been moistened. Coarse or dry cereals, such as bran, granola, and shredded wheat. Tough or chewy crusty breads, such as French bread or  baguettes. °Vegetables °Corn. Raw vegetables except shredded lettuce. Cooked vegetables that are tough or stringy. Tough, crisp, fried potatoes and potato skins. °Fruits °Fresh fruits with skins or seeds or both, such as apples, pears, or grapes. Stringy, high-pulp fruits, such as papaya, pineapple, coconut, or mango. Fruit leather, fruit roll-ups, and all dried fruits. °Meats and Other Protein Sources °Sausages and hot dogs. Meats with gristle. Fish with bones. Nuts, seeds, and chunky peanut or other nut butters. °Sweets/Desserts °Cakes or cookies that are very dry or chewy. °The items listed above may not be a complete list of foods and beverages to avoid. Contact your dietitian for more information. °This information is not intended to replace advice given to you by your health care provider. Make sure you discuss any questions you have with your health care provider. °Document Released: 08/10/2007 Document Revised: 10/09/2015 Document Reviewed: 03/30/2013 °Elsevier Interactive Patient Education © 2017 Elsevier Inc. ° °

## 2017-11-15 NOTE — Plan of Care (Signed)
  Problem: Education: Goal: Knowledge of General Education information will improve Outcome: Progressing Note:  POC reviewed with pt.   

## 2017-11-15 NOTE — Discharge Summary (Signed)
  [facility] Discharge Summary   Patient ID: Edward MoronJerry L Sferrazza MRN: [id number] DOB/AGE: 55/01/1961 55 y.o.  Admit date: 11/12/2017 Discharge date: 11/15/2017  Admitting Diagnosis: SBO  Discharge Diagnosis Patient Active Problem List   Diagnosis Date Noted  . SBO (small bowel obstruction) (HCC) 11/12/2017    Consultants None  Imaging: No results found.  Procedures  Hospital Course:  Edward Meadows is a 57yo male with prior h/o umbilical hernia repair with mesh who [facility] with 5 days of abdominal pain, nausea, and vomiting.  Workup showed SBO.  Patient was admitted, NG tube placed, and started on small bowel protocol. Delayed film showed contrast in the colon with improved dilation of small bowel. Once bowel function returned NG tube was removed and diet advanced as tolerated. Of note, MRI liver was obtained to evaluate liver lesion seen on u/s; this was normal. On 7/2 the patient was voiding well, tolerating diet, having bowel function, ambulating well, pain well controlled, vital signs stable and felt stable for discharge home.  Patient will follow up as below and knows to call with questions or concerns.     Physical Exam: Gen:  Alert, NAD, pleasant HEENT: EOM's intact, pupils equal and round Card:  RRR, no M/G/R heard Pulm:  CTAB, no W/R/R, effort normal Abd: Soft, NT, +BS, no HSM, no hernia Ext:  Calves soft and nontender Psych: A&Ox3  Skin: no rashes noted, warm and dry   Allergies as of 11/15/2017   No Known Allergies     Medication List    TAKE these medications   BYSTOLIC 5 MG tablet Generic drug:  nebivolol Take 5 mg by mouth daily.   COMBIVENT RESPIMAT 20-100 MCG/ACT Aers respimat Generic drug:  Ipratropium-Albuterol Take 1 puff by mouth every 6 (six) hours as needed for wheezing or shortness of breath.   rosuvastatin 40 MG tablet Commonly known as:  CRESTOR Take 40 mg by mouth daily.        Follow-up Information    [facility] [place] Call.   Specialty:  General Surgery Why:  as needed, you do not have to schedule an appointment Contact information: [address] [place] [id number] [phone]       Wilmer Floorampbell, Stephen D., MD Follow up.   Specialty:  Internal Medicine Contact information: [address] [place] [id number] [phone]           Signed: Franne FortsBrooke A Meuth, [facility] 11/15/2017, 8:00 AM Pager: [phone] Consults: [phone] Mon 7:00 am -11:30 AM Tues-Fri 7:00 am-4:30 pm Sat-Sun 7:00 am-11:30 am

## 2017-11-16 ENCOUNTER — Other Ambulatory Visit: Payer: Self-pay | Admitting: *Deleted

## 2017-11-16 NOTE — Patient Outreach (Addendum)
[facility] Care Management  11/16/2017  Doy HutchingJerry L Frentz 03/20/1961 [id number]   Subjective: Telephone call to patient's home number, no answer, left HIPAA compliant voicemail message, and requested call back.      Objective:  Per KPN (Knowledge Performance Now, point of care tool) and chart review, patient hospitalized 11/12/17 -11/15/17 for small bowel obstruction.   Patient also has a history of hypertension.     Assessment: Received [facility] Transition of care referral on 11/14/17.   Transition of care follow up pending patient contact.      Plan: RNCM will send unsuccessful outreach  letter, [facility] pamphlet, will ask covering RNCM to call patient for 2nd telephone outreach attempt within 4 business days, transition of care follow up, and proceed with case closure, within 10 business days if no return call.       Sharrieff Spratlin H. Gardiner Barefootooper RN, BSN, CCM [facility] Care Management [facility] Telephonic CM Phone: [phone] Fax: [phone]

## 2017-11-18 ENCOUNTER — Encounter: Payer: Self-pay | Admitting: *Deleted

## 2017-11-18 ENCOUNTER — Other Ambulatory Visit: Payer: Self-pay | Admitting: *Deleted

## 2017-11-18 NOTE — Patient Outreach (Signed)
[facility] Care Management  11/18/2017  Edward Meadows 05/07/1961 [id number]    Second outreach for post hospital discharge transition of care call. Spoke with Edward Meadows at his home number and transition of care assessment completed. See transition of care template for details. Edward Meadows [facility] for small bowel obstruction and was discharged to home on 7/2 without the need for home health service of DME. He did not require surgery. Edward Meadows states he is tolerating liquids and small amount of soft food. He denies pain, fever, bowel or bladder issues. Says he is no longer having diarrhea like he was prior to and during his hospital admission.  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 No case management needs identified so will close case to [facility] Care Management services and mail successful outreach letter to [place].   Edward RichardJanet S. Hauser RN,CCM,CDE [facility] Care Management Coordinator Office Phone [phone] Office Fax [phone]

## 2017-11-22 DIAGNOSIS — Z6835 Body mass index (BMI) 35.0-35.9, adult: Secondary | ICD-10-CM | POA: Diagnosis not present

## 2017-11-22 DIAGNOSIS — K56609 Unspecified intestinal obstruction, unspecified as to partial versus complete obstruction: Secondary | ICD-10-CM | POA: Diagnosis not present

## 2017-11-22 DIAGNOSIS — Z79899 Other long term (current) drug therapy: Secondary | ICD-10-CM | POA: Diagnosis not present

## 2017-11-22 DIAGNOSIS — Z09 Encounter for follow-up examination after completed treatment for conditions other than malignant neoplasm: Secondary | ICD-10-CM | POA: Diagnosis not present

## 2017-11-23 MED FILL — BYSTOLIC 5 MG TABLET: 5 | 90 days supply | Qty: 90 | Fill #0

## 2017-11-23 MED FILL — ROSUVASTATIN CALCIUM 40 MG: 40 | 90 days supply | Qty: 90 | Fill #0

## 2017-11-23 MED FILL — COMBIVENT RESPIMAT INHAL SP: 20-100 | 30 days supply | Qty: 4 | Fill #0

## 2018-03-28 DIAGNOSIS — Z1331 Encounter for screening for depression: Secondary | ICD-10-CM | POA: Diagnosis not present

## 2018-03-28 DIAGNOSIS — Z131 Encounter for screening for diabetes mellitus: Secondary | ICD-10-CM | POA: Diagnosis not present

## 2018-03-28 DIAGNOSIS — I10 Essential (primary) hypertension: Secondary | ICD-10-CM | POA: Diagnosis not present

## 2018-03-28 DIAGNOSIS — Z1329 Encounter for screening for other suspected endocrine disorder: Secondary | ICD-10-CM | POA: Diagnosis not present

## 2018-03-28 DIAGNOSIS — Z125 Encounter for screening for malignant neoplasm of prostate: Secondary | ICD-10-CM | POA: Diagnosis not present

## 2018-03-28 DIAGNOSIS — Z79899 Other long term (current) drug therapy: Secondary | ICD-10-CM | POA: Diagnosis not present

## 2018-03-28 DIAGNOSIS — Z6836 Body mass index (BMI) 36.0-36.9, adult: Secondary | ICD-10-CM | POA: Diagnosis not present

## 2018-03-28 DIAGNOSIS — Z0000 Encounter for general adult medical examination without abnormal findings: Secondary | ICD-10-CM | POA: Diagnosis not present

## 2018-03-28 DIAGNOSIS — E785 Hyperlipidemia, unspecified: Secondary | ICD-10-CM | POA: Diagnosis not present

## 2018-04-17 MED FILL — ROSUVASTATIN CALCIUM 40 MG: 40 | 90 days supply | Qty: 90 | Fill #1

## 2018-04-17 MED FILL — BYSTOLIC 5 MG TABLET: 5 | 90 days supply | Qty: 90 | Fill #1

## 2018-07-07 DIAGNOSIS — H524 Presbyopia: Secondary | ICD-10-CM | POA: Diagnosis not present

## 2018-08-14 MED FILL — BYSTOLIC 5 MG TABLET: 5 | 90 days supply | Qty: 90 | Fill #0

## 2018-08-14 MED FILL — ROSUVASTATIN CALCIUM 40 MG: 40 | 90 days supply | Qty: 90 | Fill #0

## 2019-01-30 MED FILL — BYSTOLIC 5 MG TABLET: 5 | 90 days supply | Qty: 90 | Fill #0

## 2019-01-30 MED FILL — ROSUVASTATIN CALCIUM 40 MG: 40 | 90 days supply | Qty: 90 | Fill #0

## 2019-04-26 MED FILL — COMBIVENT RESPIMAT INHAL SP: 20-100 | 30 days supply | Qty: 4 | Fill #0 | Status: TO

## 2019-04-28 MED FILL — BYSTOLIC 5 MG TABLET: 5 | 90 days supply | Qty: 90 | Fill #1

## 2019-04-28 MED FILL — COMBIVENT RESPIMAT INHAL SP: 20-100 | 30 days supply | Qty: 4 | Fill #0

## 2019-04-28 MED FILL — ROSUVASTATIN CALCIUM 40 MG: 40 | 90 days supply | Qty: 90 | Fill #1

## 2019-10-17 MED FILL — BYSTOLIC 5 MG TABLET: 5 | 90 days supply | Qty: 90 | Fill #0

## 2019-10-17 MED FILL — ROSUVASTATIN CALCIUM 40 MG: 40 | 90 days supply | Qty: 90 | Fill #0

## 2019-11-08 ENCOUNTER — Other Ambulatory Visit (HOSPITAL_COMMUNITY): Payer: Self-pay | Admitting: Internal Medicine

## 2019-11-08 DIAGNOSIS — Z0000 Encounter for general adult medical examination without abnormal findings: Secondary | ICD-10-CM | POA: Diagnosis not present

## 2019-11-08 DIAGNOSIS — Z1329 Encounter for screening for other suspected endocrine disorder: Secondary | ICD-10-CM | POA: Diagnosis not present

## 2019-11-08 DIAGNOSIS — E669 Obesity, unspecified: Secondary | ICD-10-CM | POA: Diagnosis not present

## 2019-11-08 DIAGNOSIS — E785 Hyperlipidemia, unspecified: Secondary | ICD-10-CM | POA: Diagnosis not present

## 2019-11-08 DIAGNOSIS — Z125 Encounter for screening for malignant neoplasm of prostate: Secondary | ICD-10-CM | POA: Diagnosis not present

## 2019-11-08 DIAGNOSIS — Z131 Encounter for screening for diabetes mellitus: Secondary | ICD-10-CM | POA: Diagnosis not present

## 2019-11-08 DIAGNOSIS — Z79899 Other long term (current) drug therapy: Secondary | ICD-10-CM | POA: Diagnosis not present

## 2019-11-08 DIAGNOSIS — I10 Essential (primary) hypertension: Secondary | ICD-10-CM | POA: Diagnosis not present

## 2019-11-08 DIAGNOSIS — Z6836 Body mass index (BMI) 36.0-36.9, adult: Secondary | ICD-10-CM | POA: Diagnosis not present

## 2019-11-08 MED FILL — LISINOPRIL 10 MG TABS: 10 | 90 days supply | Qty: 90 | Fill #0

## 2019-11-09 ENCOUNTER — Other Ambulatory Visit (HOSPITAL_COMMUNITY): Payer: Self-pay | Admitting: Internal Medicine

## 2019-11-09 MED FILL — METFORMIN HCL 500 MG TABS: 500 | 90 days supply | Qty: 180 | Fill #0

## 2019-11-09 MED FILL — COMBIVENT RESPIMAT INHAL SP: 20-100 | 30 days supply | Qty: 8 | Fill #0

## 2020-01-19 IMAGING — MR MR ABDOMEN WO/W CM
14 series · 46 of 48 positions shown · IV contrast (Contrast agent)
Comparison: Ultrasound of 11/10/2017.  CT scan from 11/10/2017

CLINICAL DATA: Hypoechoic hepatic lesions suggested on ultrasound,
for further characterization.

EXAM:
MRI ABDOMEN WITHOUT AND WITH CONTRAST
TECHNIQUE: Multiplanar multisequence MR imaging of the abdomen was performed
both before and after the administration of intravenous contrast.
CONTRAST:  20mL MULTIHANCE GADOBENATE DIMEGLUMINE 529 MG/ML IV SOLN

[Series 4: cor haste · coronal · 6.0mm · 1.21mm/px · 3 of 38 slices shown]
[im 1/38]
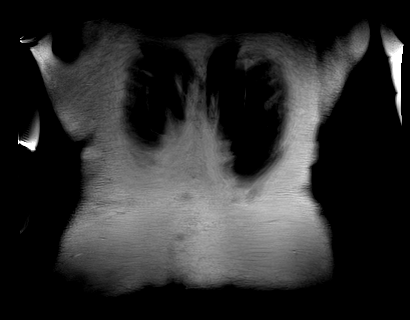
[im 19/38]
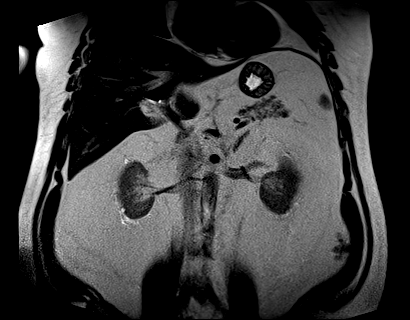
[im 38/38]
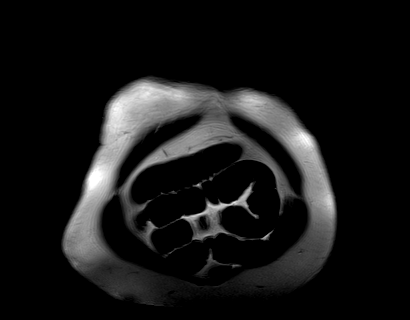

[Series 5: ax haste · axial · 6.0mm · 1.38mm/px · z∈[-112,+140]mm · 2 of 36 slices shown]
[im 1/36]
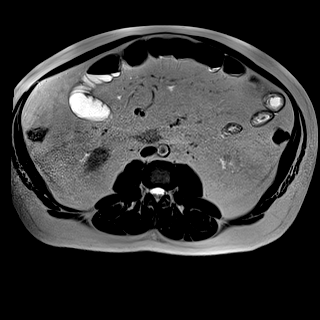
[im 36/36]
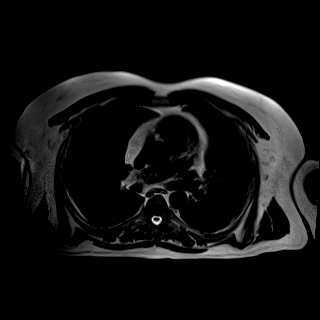

[Series 8: T2 fat-sat · axial · 6.0mm · 1.31mm/px · z∈[-116,+143]mm · 2 of 37 slices shown]
[im 1/37]
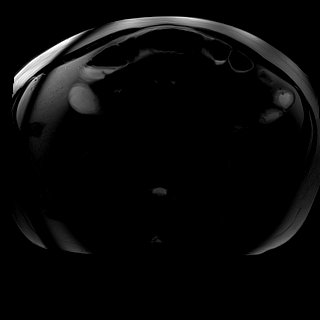
[im 37/37]
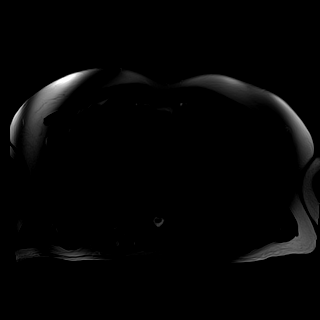

[Series 12: t1_vibe_opp-in_tra_p4_bh · axial · 3.0mm · 1.33mm/px · z∈[-90,+147]mm · 8 of 160 slices shown]
[im 1/160]
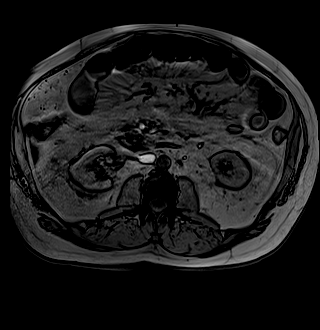
[im 23/160]
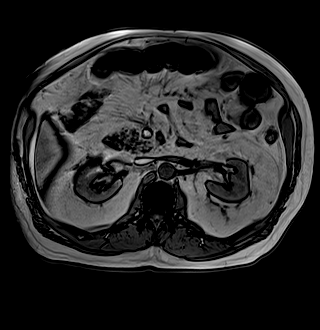
[im 46/160]
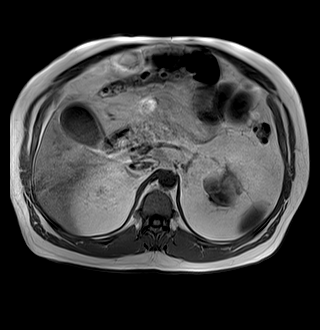
[im 69/160]
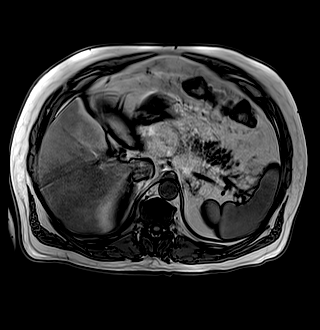
[im 91/160]
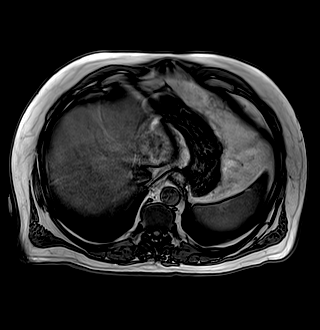
[im 114/160]
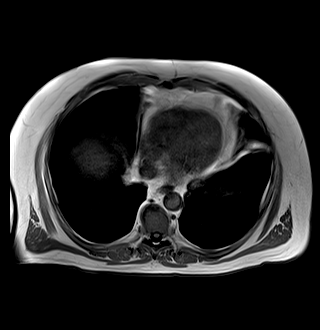
[im 137/160]
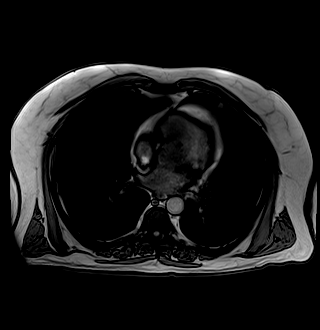
[im 160/160]
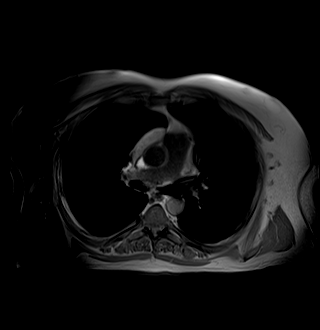

[Series 13: bSSFP · axial · 6.0mm · 0.74mm/px · z∈[-112,+140]mm · 2 of 36 slices shown]
[im 1/36]
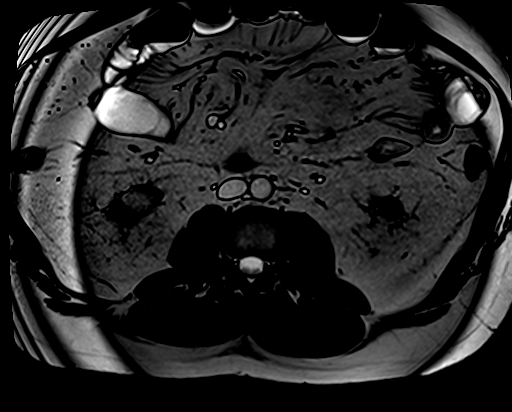
[im 36/36]
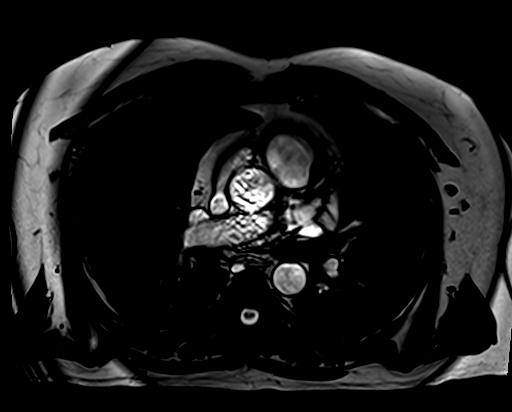

[Series 14: DWI · axial · 6.0mm · 1.57mm/px · z∈[-127,+154]mm · 6 of 120 slices shown (1 of 2)]
[im 1/120]
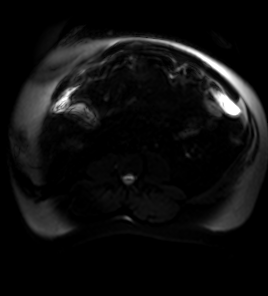
[im 24/120]
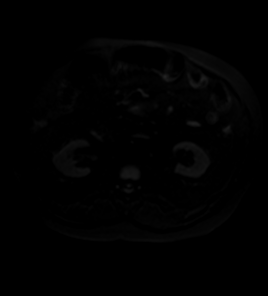
[im 48/120]
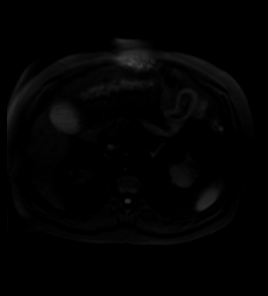
[im 72/120]
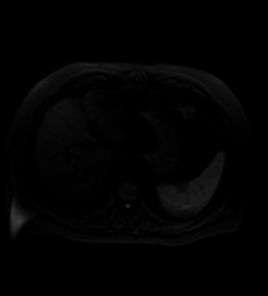
[im 96/120]
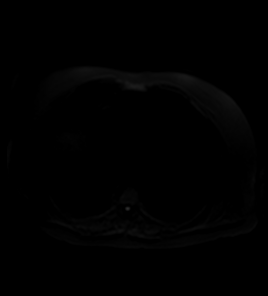
[im 120/120]
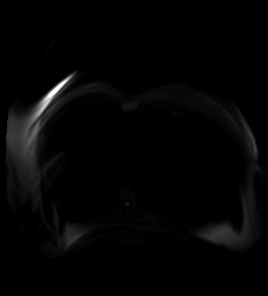

[Series 15: DWI · axial · 6.0mm · 1.57mm/px · z∈[-127,+154]mm · 2 of 40 slices shown (2 of 2)]
[im 1/40]
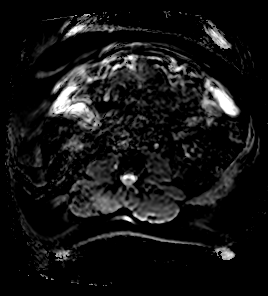
[im 40/40]
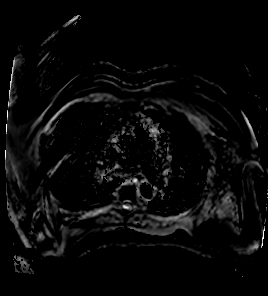

[Series 16: t1_vibe_fs_tra_p4_bh_pre · axial · 3.0mm · 1.33mm/px · z∈[-97,+140]mm · 4 of 80 slices shown (1 of 2)]
[im 1/80]
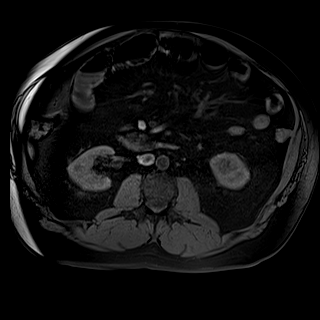
[im 27/80]
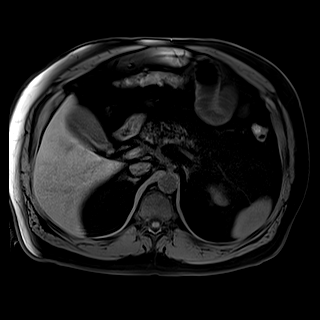
[im 53/80]
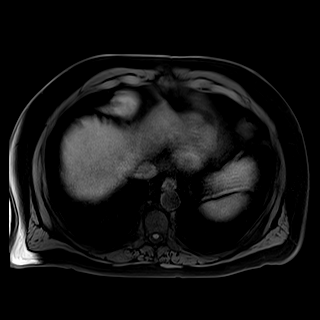
[im 80/80]
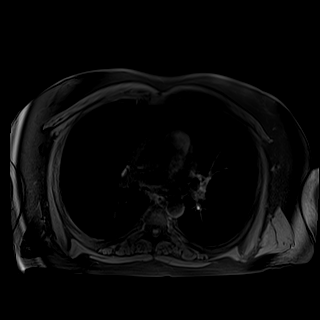

[Series 17: t1_vibe_fs_tra_p4_bh_pre · axial · 3.0mm · 1.33mm/px · z∈[-112,+149]mm · 4 of 88 slices shown (2 of 2)]
[im 1/88]
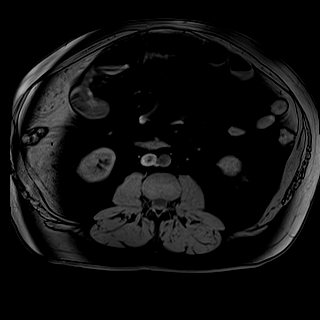
[im 30/88]
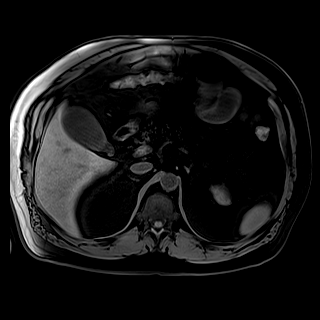
[im 59/88]
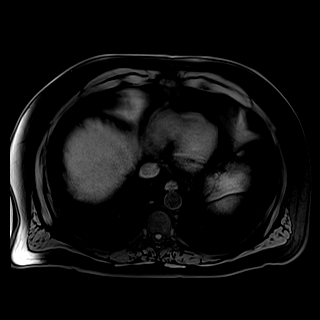
[im 88/88]
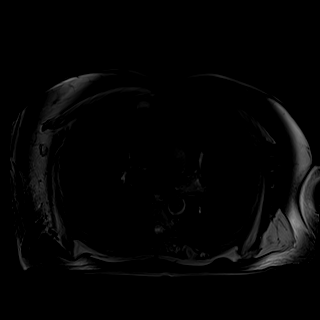

[Series 20: t1_vibe_fs_tra_p4_bh_post · axial · 3.0mm · 1.19mm/px · z∈[-85,+128]mm · 3 of 72 slices shown (1 of 4)]
[im 1/72]
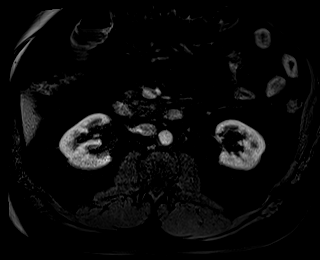
[im 36/72]
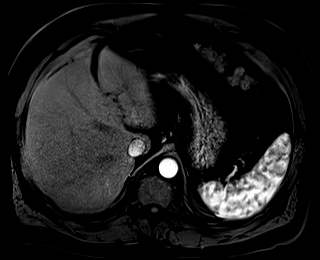
[im 72/72]
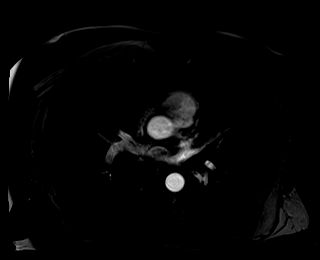

[Series 21: t1_vibe_fs_tra_p4_bh_post · axial · 3.0mm · 1.19mm/px · z∈[-85,+128]mm · 3 of 72 slices shown (2 of 4)]
[im 1/72]
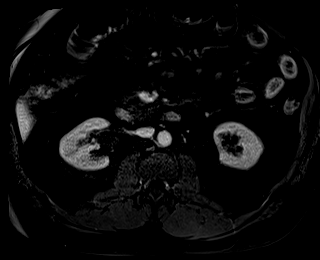
[im 36/72]
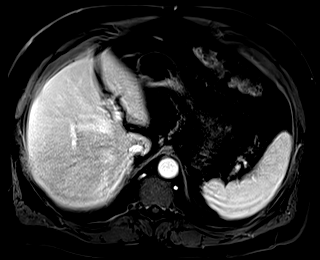
[im 72/72]
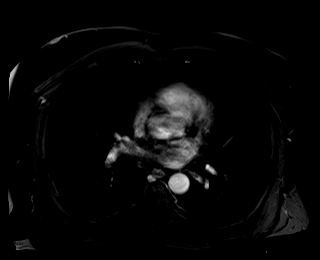

[Series 22: t1_vibe_fs_tra_p4_bh_post · axial · 3.0mm · 1.19mm/px · z∈[-85,+128]mm · 3 of 72 slices shown (3 of 4)]
[im 1/72]
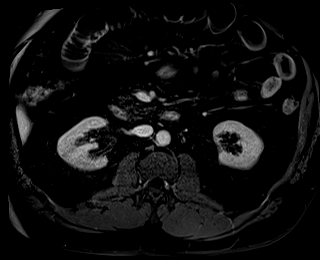
[im 36/72]
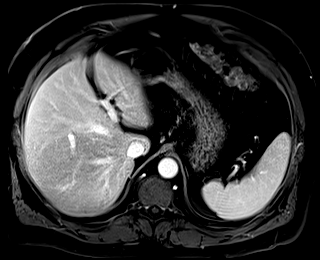
[im 72/72]
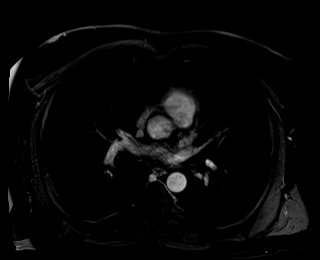

[Series 23: t1_vibe_fs_tra_p4_bh_post · axial · 3.0mm · 1.19mm/px · z∈[-85,+128]mm · 3 of 72 slices shown (4 of 4)]
[im 1/72]
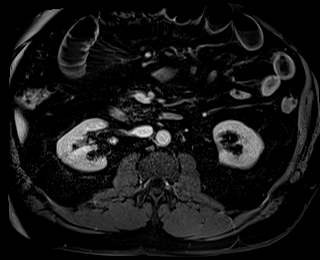
[im 36/72]
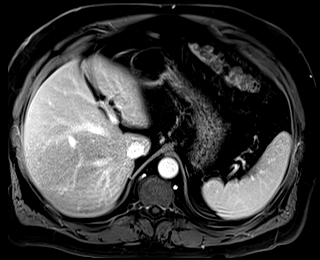
[im 72/72]
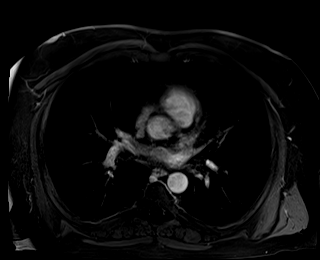

[Series 24: T1 dynamic post-contrast · coronal · 3.0mm · 1.31mm/px · 1 of 72 slices shown]
[im 1/72]
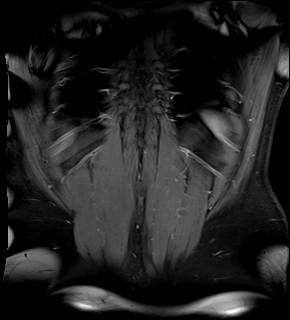

[46 of 48 positions shown; findings below may reference images not displayed]

FINDINGS: Despite efforts by the technologist and patient, motion artifact is
present on today's exam and could not be eliminated. This reduces
exam sensitivity and specificity.

Lower chest: Unremarkable

Hepatobiliary: No appreciable focal lesion corresponds with the
finding at ultrasound gallbladder unremarkable. Mild geographic
hepatic steatosis.

Pancreas:  Unremarkable

Spleen:  Unremarkable

Adrenals/Urinary Tract: Several tiny hypodense lesions in the
kidneys are nonenhancing and statistically likely to be small benign
cysts although technically nonspecific due to small size. Adrenal
glands normal.

Stomach/Bowel: There some mildly dilated loops of mid abdominal
small bowel in the upper abdomen.

Vascular/Lymphatic:  Aortoiliac atherosclerotic vascular disease.

Other:  Trace edema in the small bowel mesentery.

Musculoskeletal: Unremarkable
IMPRESSION: 1. No liver lesion is identified to correspond with the finding at
ultrasound. This may have represented some focal fatty sparing or
similar pseudo lesion. I am skeptical that there is a significant
liver lesion given that both CT and MRI were negative.
2. Mild geographic hepatic steatosis.
3. Mildly dilated loops of mid abdominal small bowel with trace
edema in the small bowel mesentery.
4.  Aortic Atherosclerosis ([id number]).

## 2020-02-06 ENCOUNTER — Other Ambulatory Visit (HOSPITAL_COMMUNITY): Payer: Self-pay | Admitting: Internal Medicine

## 2020-02-06 MED FILL — LISINOPRIL 10 MG TABS: 10 | 90 days supply | Qty: 90 | Fill #1

## 2020-02-06 MED FILL — BYSTOLIC 5 MG TABLET: 5 | 90 days supply | Qty: 90 | Fill #0

## 2020-02-06 MED FILL — ROSUVASTATIN CALCIUM 40 MG: 40 | 90 days supply | Qty: 90 | Fill #0

## 2020-03-18 DIAGNOSIS — Z03818 Encounter for observation for suspected exposure to other biological agents ruled out: Secondary | ICD-10-CM | POA: Diagnosis not present

## 2020-03-18 DIAGNOSIS — Z20822 Contact with and (suspected) exposure to covid-19: Secondary | ICD-10-CM | POA: Diagnosis not present

## 2020-04-08 MED FILL — METFORMIN HCL 500 MG TABS: 500 | 90 days supply | Qty: 180 | Fill #1

## 2020-05-13 MED FILL — ROSUVASTATIN CALCIUM 40 MG: 40 | 90 days supply | Qty: 90 | Fill #1

## 2020-05-13 MED FILL — NEBIVOLOL HCL 5 MG TABS: 5 | 90 days supply | Qty: 90 | Fill #1

## 2020-05-13 MED FILL — LISINOPRIL 10 MG TABS: 10 | 90 days supply | Qty: 90 | Fill #2

## 2020-07-14 DIAGNOSIS — H52203 Unspecified astigmatism, bilateral: Secondary | ICD-10-CM | POA: Diagnosis not present

## 2020-07-31 DIAGNOSIS — Z20822 Contact with and (suspected) exposure to covid-19: Secondary | ICD-10-CM | POA: Diagnosis not present

## 2020-07-31 DIAGNOSIS — Z03818 Encounter for observation for suspected exposure to other biological agents ruled out: Secondary | ICD-10-CM | POA: Diagnosis not present

## 2020-08-25 ENCOUNTER — Other Ambulatory Visit (HOSPITAL_COMMUNITY): Payer: Self-pay

## 2020-08-25 MED FILL — Lisinopril Tab 10 MG: ORAL | 90 days supply | Qty: 90 | Fill #0 | Status: AC

## 2020-08-25 MED FILL — Nebivolol HCl Tab 5 MG (Base Equivalent): ORAL | 90 days supply | Qty: 90 | Fill #0 | Status: AC

## 2020-08-25 MED FILL — Rosuvastatin Calcium Tab 40 MG: ORAL | 90 days supply | Qty: 90 | Fill #0 | Status: AC

## 2020-08-27 ENCOUNTER — Other Ambulatory Visit (HOSPITAL_COMMUNITY): Payer: Self-pay

## 2020-10-07 ENCOUNTER — Other Ambulatory Visit (HOSPITAL_COMMUNITY): Payer: Self-pay

## 2020-10-07 MED ORDER — METFORMIN HCL 500 MG PO TABS
500.0000 mg | ORAL_TABLET | Freq: Two times a day (BID) | ORAL | 1 refills | Status: DC
  Filled 2020-10-07: qty 180, 90d supply, fill #0
  Filled 2021-02-27: qty 180, 90d supply, fill #1

## 2020-10-23 DIAGNOSIS — J029 Acute pharyngitis, unspecified: Secondary | ICD-10-CM | POA: Diagnosis not present

## 2020-10-23 DIAGNOSIS — R0982 Postnasal drip: Secondary | ICD-10-CM | POA: Diagnosis not present

## 2020-11-10 DIAGNOSIS — E785 Hyperlipidemia, unspecified: Secondary | ICD-10-CM | POA: Diagnosis not present

## 2020-11-10 DIAGNOSIS — Z1329 Encounter for screening for other suspected endocrine disorder: Secondary | ICD-10-CM | POA: Diagnosis not present

## 2020-11-10 DIAGNOSIS — Z125 Encounter for screening for malignant neoplasm of prostate: Secondary | ICD-10-CM | POA: Diagnosis not present

## 2020-11-10 DIAGNOSIS — Z131 Encounter for screening for diabetes mellitus: Secondary | ICD-10-CM | POA: Diagnosis not present

## 2020-11-10 DIAGNOSIS — I10 Essential (primary) hypertension: Secondary | ICD-10-CM | POA: Diagnosis not present

## 2020-11-10 DIAGNOSIS — Z6835 Body mass index (BMI) 35.0-35.9, adult: Secondary | ICD-10-CM | POA: Diagnosis not present

## 2020-11-10 DIAGNOSIS — Z0000 Encounter for general adult medical examination without abnormal findings: Secondary | ICD-10-CM | POA: Diagnosis not present

## 2020-11-19 ENCOUNTER — Other Ambulatory Visit (HOSPITAL_COMMUNITY): Payer: Self-pay

## 2020-11-19 MED ORDER — OZEMPIC (0.25 OR 0.5 MG/DOSE) 2 MG/1.5ML ~~LOC~~ SOPN
PEN_INJECTOR | SUBCUTANEOUS | 3 refills | Status: DC
  Filled 2020-11-19: qty 4.5, 90d supply, fill #0
  Filled 2020-11-20: qty 4.5, 84d supply, fill #0
  Filled 2021-03-18: qty 4.5, 84d supply, fill #1
  Filled 2021-09-09: qty 4.5, 84d supply, fill #2

## 2020-11-20 ENCOUNTER — Other Ambulatory Visit (HOSPITAL_COMMUNITY): Payer: Self-pay

## 2020-12-13 MED FILL — Nebivolol HCl Tab 5 MG (Base Equivalent): ORAL | 90 days supply | Qty: 90 | Fill #1 | Status: AC

## 2020-12-15 ENCOUNTER — Other Ambulatory Visit (HOSPITAL_COMMUNITY): Payer: Self-pay

## 2020-12-31 ENCOUNTER — Other Ambulatory Visit (HOSPITAL_COMMUNITY): Payer: Self-pay

## 2020-12-31 MED ORDER — LISINOPRIL 10 MG PO TABS
10.0000 mg | ORAL_TABLET | Freq: Every day | ORAL | 3 refills | Status: DC
  Filled 2020-12-31: qty 90, 90d supply, fill #0
  Filled 2021-03-18: qty 90, 90d supply, fill #1
  Filled 2021-06-26: qty 90, 90d supply, fill #2
  Filled 2021-09-28: qty 90, 90d supply, fill #3

## 2021-01-02 DIAGNOSIS — F39 Unspecified mood [affective] disorder: Secondary | ICD-10-CM | POA: Diagnosis not present

## 2021-01-02 DIAGNOSIS — Z79899 Other long term (current) drug therapy: Secondary | ICD-10-CM | POA: Diagnosis not present

## 2021-01-02 DIAGNOSIS — Z6833 Body mass index (BMI) 33.0-33.9, adult: Secondary | ICD-10-CM | POA: Diagnosis not present

## 2021-01-15 ENCOUNTER — Other Ambulatory Visit (HOSPITAL_COMMUNITY): Payer: Self-pay

## 2021-01-15 DIAGNOSIS — F39 Unspecified mood [affective] disorder: Secondary | ICD-10-CM | POA: Diagnosis not present

## 2021-01-15 DIAGNOSIS — Z6833 Body mass index (BMI) 33.0-33.9, adult: Secondary | ICD-10-CM | POA: Diagnosis not present

## 2021-01-15 DIAGNOSIS — Z79899 Other long term (current) drug therapy: Secondary | ICD-10-CM | POA: Diagnosis not present

## 2021-01-15 MED ORDER — VRAYLAR 1.5 MG PO CAPS
1.5000 mg | ORAL_CAPSULE | Freq: Every day | ORAL | 1 refills | Status: DC
  Filled 2021-01-15: qty 90, 90d supply, fill #0

## 2021-01-16 ENCOUNTER — Other Ambulatory Visit (HOSPITAL_COMMUNITY): Payer: Self-pay

## 2021-02-27 ENCOUNTER — Other Ambulatory Visit (HOSPITAL_COMMUNITY): Payer: Self-pay

## 2021-03-18 ENCOUNTER — Other Ambulatory Visit (HOSPITAL_COMMUNITY): Payer: Self-pay

## 2021-03-19 ENCOUNTER — Other Ambulatory Visit (HOSPITAL_COMMUNITY): Payer: Self-pay

## 2021-03-19 MED ORDER — NEBIVOLOL HCL 5 MG PO TABS
5.0000 mg | ORAL_TABLET | Freq: Every day | ORAL | 3 refills | Status: DC
  Filled 2021-03-19: qty 90, 90d supply, fill #0
  Filled 2021-06-26: qty 90, 90d supply, fill #1
  Filled 2021-09-28: qty 90, 90d supply, fill #2
  Filled 2021-12-29: qty 90, 90d supply, fill #3

## 2021-03-20 ENCOUNTER — Other Ambulatory Visit (HOSPITAL_COMMUNITY): Payer: Self-pay

## 2021-03-30 ENCOUNTER — Other Ambulatory Visit (HOSPITAL_COMMUNITY): Payer: Self-pay

## 2021-03-30 MED ORDER — OZEMPIC (0.25 OR 0.5 MG/DOSE) 2 MG/1.5ML ~~LOC~~ SOPN
0.5000 mg | PEN_INJECTOR | SUBCUTANEOUS | 0 refills | Status: DC
  Filled 2021-03-30: qty 4.5, 84d supply, fill #0

## 2021-03-30 MED ORDER — METFORMIN HCL 500 MG PO TABS
500.0000 mg | ORAL_TABLET | Freq: Two times a day (BID) | ORAL | 0 refills | Status: DC
  Filled 2021-03-30 – 2021-09-09 (×2): qty 180, 90d supply, fill #0

## 2021-03-30 MED ORDER — ROSUVASTATIN CALCIUM 40 MG PO TABS
40.0000 mg | ORAL_TABLET | Freq: Every day | ORAL | 1 refills | Status: DC
  Filled 2021-03-30: qty 90, 90d supply, fill #0
  Filled 2021-09-09: qty 90, 90d supply, fill #1

## 2021-04-16 DIAGNOSIS — Z6834 Body mass index (BMI) 34.0-34.9, adult: Secondary | ICD-10-CM | POA: Diagnosis not present

## 2021-04-16 DIAGNOSIS — E1165 Type 2 diabetes mellitus with hyperglycemia: Secondary | ICD-10-CM | POA: Diagnosis not present

## 2021-04-16 DIAGNOSIS — Z79899 Other long term (current) drug therapy: Secondary | ICD-10-CM | POA: Diagnosis not present

## 2021-04-16 DIAGNOSIS — I10 Essential (primary) hypertension: Secondary | ICD-10-CM | POA: Diagnosis not present

## 2021-04-16 DIAGNOSIS — F39 Unspecified mood [affective] disorder: Secondary | ICD-10-CM | POA: Diagnosis not present

## 2021-04-16 DIAGNOSIS — E785 Hyperlipidemia, unspecified: Secondary | ICD-10-CM | POA: Diagnosis not present

## 2021-05-19 ENCOUNTER — Other Ambulatory Visit (HOSPITAL_COMMUNITY): Payer: Self-pay

## 2021-05-19 MED ORDER — VRAYLAR 3 MG PO CAPS
3.0000 mg | ORAL_CAPSULE | Freq: Every day | ORAL | 3 refills | Status: DC
  Filled 2021-05-19: qty 90, 90d supply, fill #0
  Filled 2021-11-08: qty 90, 90d supply, fill #1

## 2021-05-25 ENCOUNTER — Other Ambulatory Visit (HOSPITAL_COMMUNITY): Payer: Self-pay

## 2021-06-29 ENCOUNTER — Other Ambulatory Visit (HOSPITAL_COMMUNITY): Payer: Self-pay

## 2021-09-09 ENCOUNTER — Other Ambulatory Visit (HOSPITAL_COMMUNITY): Payer: Self-pay

## 2021-09-09 MED ORDER — OZEMPIC (0.25 OR 0.5 MG/DOSE) 2 MG/3ML ~~LOC~~ SOPN
0.5000 mg | PEN_INJECTOR | SUBCUTANEOUS | 0 refills | Status: DC
  Filled 2021-09-09: qty 3, 28d supply, fill #0

## 2021-09-11 ENCOUNTER — Other Ambulatory Visit (HOSPITAL_COMMUNITY): Payer: Self-pay

## 2021-09-28 ENCOUNTER — Other Ambulatory Visit (HOSPITAL_COMMUNITY): Payer: Self-pay

## 2021-10-15 ENCOUNTER — Other Ambulatory Visit (HOSPITAL_COMMUNITY): Payer: Self-pay

## 2021-10-15 DIAGNOSIS — Z6834 Body mass index (BMI) 34.0-34.9, adult: Secondary | ICD-10-CM | POA: Diagnosis not present

## 2021-10-15 DIAGNOSIS — F39 Unspecified mood [affective] disorder: Secondary | ICD-10-CM | POA: Diagnosis not present

## 2021-10-15 DIAGNOSIS — E785 Hyperlipidemia, unspecified: Secondary | ICD-10-CM | POA: Diagnosis not present

## 2021-10-15 DIAGNOSIS — E119 Type 2 diabetes mellitus without complications: Secondary | ICD-10-CM | POA: Diagnosis not present

## 2021-10-15 DIAGNOSIS — I10 Essential (primary) hypertension: Secondary | ICD-10-CM | POA: Diagnosis not present

## 2021-10-15 MED ORDER — UNIFINE PENTIPS 32G X 4 MM MISC
3 refills | Status: AC
  Filled 2021-10-15: qty 100, 90d supply, fill #0

## 2021-10-15 MED ORDER — OZEMPIC (1 MG/DOSE) 4 MG/3ML ~~LOC~~ SOPN
1.0000 mg | PEN_INJECTOR | SUBCUTANEOUS | 3 refills | Status: AC
  Filled 2021-10-15: qty 3, 28d supply, fill #0
  Filled 2022-06-23 – 2022-06-30 (×3): qty 3, 28d supply, fill #1
  Filled 2022-09-20: qty 3, 28d supply, fill #2

## 2021-11-09 ENCOUNTER — Other Ambulatory Visit (HOSPITAL_COMMUNITY): Payer: Self-pay

## 2021-12-29 ENCOUNTER — Other Ambulatory Visit (HOSPITAL_COMMUNITY): Payer: Self-pay

## 2021-12-29 MED ORDER — LISINOPRIL 10 MG PO TABS
10.0000 mg | ORAL_TABLET | Freq: Every day | ORAL | 1 refills | Status: DC
  Filled 2021-12-29: qty 90, 90d supply, fill #0
  Filled 2022-03-30: qty 90, 90d supply, fill #1

## 2021-12-29 MED ORDER — ROSUVASTATIN CALCIUM 40 MG PO TABS
40.0000 mg | ORAL_TABLET | Freq: Every day | ORAL | 1 refills | Status: DC
  Filled 2021-12-29: qty 90, 90d supply, fill #0
  Filled 2022-03-30: qty 90, 90d supply, fill #1

## 2022-01-27 ENCOUNTER — Other Ambulatory Visit (HOSPITAL_COMMUNITY): Payer: Self-pay

## 2022-01-27 MED ORDER — COMBIVENT RESPIMAT 20-100 MCG/ACT IN AERS
2.0000 | INHALATION_SPRAY | Freq: Four times a day (QID) | RESPIRATORY_TRACT | 5 refills | Status: AC | PRN
  Filled 2022-01-27: qty 4, 20d supply, fill #0
  Filled 2022-02-04: qty 4, 15d supply, fill #0

## 2022-02-04 ENCOUNTER — Other Ambulatory Visit (HOSPITAL_COMMUNITY): Payer: Self-pay

## 2022-03-30 ENCOUNTER — Other Ambulatory Visit (HOSPITAL_COMMUNITY): Payer: Self-pay

## 2022-03-31 ENCOUNTER — Other Ambulatory Visit (HOSPITAL_COMMUNITY): Payer: Self-pay

## 2022-03-31 MED ORDER — METFORMIN HCL 500 MG PO TABS
500.0000 mg | ORAL_TABLET | Freq: Two times a day (BID) | ORAL | 0 refills | Status: DC
  Filled 2022-03-31: qty 180, 90d supply, fill #0

## 2022-03-31 MED ORDER — NEBIVOLOL HCL 5 MG PO TABS
5.0000 mg | ORAL_TABLET | Freq: Every day | ORAL | 0 refills | Status: DC
  Filled 2022-03-31: qty 90, 90d supply, fill #0

## 2022-04-22 DIAGNOSIS — F39 Unspecified mood [affective] disorder: Secondary | ICD-10-CM | POA: Diagnosis not present

## 2022-04-22 DIAGNOSIS — E785 Hyperlipidemia, unspecified: Secondary | ICD-10-CM | POA: Diagnosis not present

## 2022-04-22 DIAGNOSIS — I10 Essential (primary) hypertension: Secondary | ICD-10-CM | POA: Diagnosis not present

## 2022-04-22 DIAGNOSIS — Z6834 Body mass index (BMI) 34.0-34.9, adult: Secondary | ICD-10-CM | POA: Diagnosis not present

## 2022-04-22 DIAGNOSIS — E119 Type 2 diabetes mellitus without complications: Secondary | ICD-10-CM | POA: Diagnosis not present

## 2022-06-23 ENCOUNTER — Other Ambulatory Visit (HOSPITAL_COMMUNITY): Payer: Self-pay

## 2022-06-23 MED ORDER — VRAYLAR 3 MG PO CAPS
3.0000 mg | ORAL_CAPSULE | Freq: Every evening | ORAL | 3 refills | Status: DC
  Filled 2022-06-23: qty 90, 90d supply, fill #0
  Filled 2022-09-20: qty 90, 90d supply, fill #1
  Filled 2023-01-09: qty 90, 90d supply, fill #2
  Filled 2023-04-05: qty 90, 90d supply, fill #3

## 2022-06-23 MED ORDER — ROSUVASTATIN CALCIUM 40 MG PO TABS
40.0000 mg | ORAL_TABLET | Freq: Every day | ORAL | 1 refills | Status: DC
  Filled 2022-06-23: qty 90, 90d supply, fill #0
  Filled 2022-09-20: qty 90, 90d supply, fill #1

## 2022-06-23 MED ORDER — NEBIVOLOL HCL 5 MG PO TABS
5.0000 mg | ORAL_TABLET | Freq: Every day | ORAL | 0 refills | Status: DC
  Filled 2022-06-23: qty 90, 90d supply, fill #0

## 2022-06-23 MED ORDER — METFORMIN HCL 500 MG PO TABS
500.0000 mg | ORAL_TABLET | Freq: Two times a day (BID) | ORAL | 1 refills | Status: DC
  Filled 2022-06-23: qty 180, 90d supply, fill #0
  Filled 2022-09-20: qty 180, 90d supply, fill #1

## 2022-06-23 MED ORDER — LISINOPRIL 10 MG PO TABS
10.0000 mg | ORAL_TABLET | Freq: Every day | ORAL | 1 refills | Status: DC
  Filled 2022-06-23: qty 90, 90d supply, fill #0
  Filled 2022-09-20: qty 90, 90d supply, fill #1

## 2022-06-24 ENCOUNTER — Other Ambulatory Visit: Payer: Self-pay

## 2022-06-25 ENCOUNTER — Other Ambulatory Visit (HOSPITAL_COMMUNITY): Payer: Self-pay

## 2022-06-28 ENCOUNTER — Other Ambulatory Visit (HOSPITAL_COMMUNITY): Payer: Self-pay

## 2022-06-30 ENCOUNTER — Other Ambulatory Visit (HOSPITAL_COMMUNITY): Payer: Self-pay

## 2022-07-05 ENCOUNTER — Other Ambulatory Visit (HOSPITAL_COMMUNITY): Payer: Self-pay

## 2022-07-05 MED ORDER — OZEMPIC (1 MG/DOSE) 4 MG/3ML ~~LOC~~ SOPN
1.0000 mg | PEN_INJECTOR | SUBCUTANEOUS | 3 refills | Status: AC
  Filled 2022-07-25: qty 9, 84d supply, fill #0
  Filled 2022-10-18: qty 9, 84d supply, fill #1
  Filled 2023-01-09: qty 9, 84d supply, fill #2
  Filled 2023-04-05: qty 9, 84d supply, fill #3

## 2022-07-26 ENCOUNTER — Other Ambulatory Visit: Payer: Self-pay

## 2022-07-26 ENCOUNTER — Other Ambulatory Visit (HOSPITAL_COMMUNITY): Payer: Self-pay

## 2022-09-20 ENCOUNTER — Other Ambulatory Visit (HOSPITAL_COMMUNITY): Payer: Self-pay

## 2022-09-20 MED ORDER — NEBIVOLOL HCL 5 MG PO TABS
5.0000 mg | ORAL_TABLET | Freq: Every day | ORAL | 0 refills | Status: DC
  Filled 2022-09-20: qty 90, 90d supply, fill #0

## 2022-09-21 ENCOUNTER — Other Ambulatory Visit: Payer: Self-pay

## 2022-09-21 ENCOUNTER — Other Ambulatory Visit (HOSPITAL_COMMUNITY): Payer: Self-pay

## 2022-09-23 ENCOUNTER — Other Ambulatory Visit (HOSPITAL_COMMUNITY): Payer: Self-pay

## 2022-10-18 ENCOUNTER — Other Ambulatory Visit (HOSPITAL_COMMUNITY): Payer: Self-pay

## 2022-10-21 DIAGNOSIS — I10 Essential (primary) hypertension: Secondary | ICD-10-CM | POA: Diagnosis not present

## 2022-10-21 DIAGNOSIS — F39 Unspecified mood [affective] disorder: Secondary | ICD-10-CM | POA: Diagnosis not present

## 2022-10-21 DIAGNOSIS — Z6833 Body mass index (BMI) 33.0-33.9, adult: Secondary | ICD-10-CM | POA: Diagnosis not present

## 2022-10-21 DIAGNOSIS — E119 Type 2 diabetes mellitus without complications: Secondary | ICD-10-CM | POA: Diagnosis not present

## 2022-10-21 DIAGNOSIS — E785 Hyperlipidemia, unspecified: Secondary | ICD-10-CM | POA: Diagnosis not present

## 2022-10-29 DIAGNOSIS — H52203 Unspecified astigmatism, bilateral: Secondary | ICD-10-CM | POA: Diagnosis not present

## 2023-01-09 ENCOUNTER — Other Ambulatory Visit (HOSPITAL_COMMUNITY): Payer: Self-pay

## 2023-01-10 ENCOUNTER — Other Ambulatory Visit (HOSPITAL_COMMUNITY): Payer: Self-pay

## 2023-01-10 ENCOUNTER — Other Ambulatory Visit: Payer: Self-pay

## 2023-01-10 MED ORDER — NEBIVOLOL HCL 5 MG PO TABS
5.0000 mg | ORAL_TABLET | Freq: Every day | ORAL | 3 refills | Status: DC
  Filled 2023-01-10: qty 90, 90d supply, fill #0
  Filled 2023-04-05: qty 90, 90d supply, fill #1
  Filled 2023-07-04: qty 90, 90d supply, fill #2
  Filled 2023-10-03: qty 90, 90d supply, fill #3

## 2023-01-10 MED ORDER — METFORMIN HCL 500 MG PO TABS
500.0000 mg | ORAL_TABLET | Freq: Two times a day (BID) | ORAL | 1 refills | Status: DC
  Filled 2023-01-10: qty 180, 90d supply, fill #0
  Filled 2023-04-05: qty 180, 90d supply, fill #1

## 2023-01-10 MED ORDER — ROSUVASTATIN CALCIUM 40 MG PO TABS
40.0000 mg | ORAL_TABLET | Freq: Every day | ORAL | 1 refills | Status: DC
  Filled 2023-01-10: qty 90, 90d supply, fill #0
  Filled 2023-04-05: qty 90, 90d supply, fill #1

## 2023-01-10 MED ORDER — LISINOPRIL 10 MG PO TABS
10.0000 mg | ORAL_TABLET | Freq: Every day | ORAL | 3 refills | Status: DC
  Filled 2023-01-10: qty 90, 90d supply, fill #0
  Filled 2023-04-05: qty 90, 90d supply, fill #1
  Filled 2023-07-04: qty 90, 90d supply, fill #2
  Filled 2023-10-03: qty 90, 90d supply, fill #3

## 2023-01-13 DIAGNOSIS — Z6833 Body mass index (BMI) 33.0-33.9, adult: Secondary | ICD-10-CM | POA: Diagnosis not present

## 2023-01-13 DIAGNOSIS — Z0000 Encounter for general adult medical examination without abnormal findings: Secondary | ICD-10-CM | POA: Diagnosis not present

## 2023-02-02 DIAGNOSIS — Z1211 Encounter for screening for malignant neoplasm of colon: Secondary | ICD-10-CM | POA: Diagnosis not present

## 2023-02-02 DIAGNOSIS — Z1212 Encounter for screening for malignant neoplasm of rectum: Secondary | ICD-10-CM | POA: Diagnosis not present

## 2023-04-05 ENCOUNTER — Other Ambulatory Visit: Payer: Self-pay

## 2023-04-05 ENCOUNTER — Other Ambulatory Visit (HOSPITAL_COMMUNITY): Payer: Self-pay

## 2023-04-21 DIAGNOSIS — I10 Essential (primary) hypertension: Secondary | ICD-10-CM | POA: Diagnosis not present

## 2023-04-21 DIAGNOSIS — F39 Unspecified mood [affective] disorder: Secondary | ICD-10-CM | POA: Diagnosis not present

## 2023-04-21 DIAGNOSIS — E785 Hyperlipidemia, unspecified: Secondary | ICD-10-CM | POA: Diagnosis not present

## 2023-04-21 DIAGNOSIS — E119 Type 2 diabetes mellitus without complications: Secondary | ICD-10-CM | POA: Diagnosis not present

## 2023-04-21 DIAGNOSIS — Z6833 Body mass index (BMI) 33.0-33.9, adult: Secondary | ICD-10-CM | POA: Diagnosis not present

## 2023-07-04 ENCOUNTER — Other Ambulatory Visit: Payer: Self-pay

## 2023-07-04 ENCOUNTER — Other Ambulatory Visit (HOSPITAL_COMMUNITY): Payer: Self-pay

## 2023-07-04 MED ORDER — VRAYLAR 3 MG PO CAPS
3.0000 mg | ORAL_CAPSULE | Freq: Every day | ORAL | 3 refills | Status: AC
Start: 2023-07-04 — End: ?
  Filled 2023-07-04: qty 90, 90d supply, fill #0
  Filled 2023-10-03: qty 90, 90d supply, fill #1
  Filled 2024-01-02: qty 90, 90d supply, fill #2
  Filled 2024-04-02: qty 90, 90d supply, fill #3

## 2023-07-04 MED ORDER — METFORMIN HCL 500 MG PO TABS
500.0000 mg | ORAL_TABLET | Freq: Two times a day (BID) | ORAL | 1 refills | Status: DC
  Filled 2023-07-04: qty 180, 90d supply, fill #0
  Filled 2023-10-03: qty 180, 90d supply, fill #1

## 2023-07-04 MED ORDER — ROSUVASTATIN CALCIUM 40 MG PO TABS
40.0000 mg | ORAL_TABLET | Freq: Every day | ORAL | 1 refills | Status: DC
  Filled 2023-07-04: qty 90, 90d supply, fill #0
  Filled 2023-10-03: qty 90, 90d supply, fill #1

## 2023-10-03 ENCOUNTER — Other Ambulatory Visit (HOSPITAL_COMMUNITY): Payer: Self-pay

## 2023-10-03 ENCOUNTER — Other Ambulatory Visit: Payer: Self-pay

## 2023-10-04 ENCOUNTER — Other Ambulatory Visit: Payer: Self-pay

## 2023-10-11 ENCOUNTER — Other Ambulatory Visit: Payer: Self-pay

## 2023-10-14 DIAGNOSIS — L820 Inflamed seborrheic keratosis: Secondary | ICD-10-CM | POA: Diagnosis not present

## 2023-10-14 DIAGNOSIS — L821 Other seborrheic keratosis: Secondary | ICD-10-CM | POA: Diagnosis not present

## 2023-10-20 DIAGNOSIS — F39 Unspecified mood [affective] disorder: Secondary | ICD-10-CM | POA: Diagnosis not present

## 2023-10-20 DIAGNOSIS — I10 Essential (primary) hypertension: Secondary | ICD-10-CM | POA: Diagnosis not present

## 2023-10-20 DIAGNOSIS — E785 Hyperlipidemia, unspecified: Secondary | ICD-10-CM | POA: Diagnosis not present

## 2023-10-20 DIAGNOSIS — E119 Type 2 diabetes mellitus without complications: Secondary | ICD-10-CM | POA: Diagnosis not present

## 2023-10-20 DIAGNOSIS — Z6836 Body mass index (BMI) 36.0-36.9, adult: Secondary | ICD-10-CM | POA: Diagnosis not present

## 2024-01-02 ENCOUNTER — Other Ambulatory Visit: Payer: Self-pay

## 2024-01-02 ENCOUNTER — Other Ambulatory Visit (HOSPITAL_COMMUNITY): Payer: Self-pay

## 2024-01-02 MED ORDER — METFORMIN HCL 500 MG PO TABS
500.0000 mg | ORAL_TABLET | Freq: Two times a day (BID) | ORAL | 1 refills | Status: AC
  Filled 2024-01-02: qty 180, 90d supply, fill #0
  Filled 2024-04-02: qty 180, 90d supply, fill #1

## 2024-01-02 MED ORDER — LISINOPRIL 10 MG PO TABS
10.0000 mg | ORAL_TABLET | Freq: Every day | ORAL | 3 refills | Status: AC
  Filled 2024-01-02: qty 90, 90d supply, fill #0
  Filled 2024-04-02: qty 90, 90d supply, fill #1

## 2024-01-02 MED ORDER — LISINOPRIL 10 MG PO TABS
10.0000 mg | ORAL_TABLET | Freq: Every day | ORAL | 3 refills | Status: AC
  Filled 2024-01-02: qty 90, 90d supply, fill #0

## 2024-01-02 MED ORDER — NEBIVOLOL HCL 5 MG PO TABS
5.0000 mg | ORAL_TABLET | Freq: Every day | ORAL | 3 refills | Status: AC
  Filled 2024-01-02: qty 90, 90d supply, fill #0
  Filled 2024-04-02: qty 90, 90d supply, fill #1

## 2024-01-02 MED ORDER — ROSUVASTATIN CALCIUM 40 MG PO TABS
40.0000 mg | ORAL_TABLET | Freq: Every day | ORAL | 1 refills | Status: AC
  Filled 2024-01-02: qty 90, 90d supply, fill #0
  Filled 2024-04-02: qty 90, 90d supply, fill #1

## 2024-01-03 ENCOUNTER — Other Ambulatory Visit: Payer: Self-pay

## 2024-01-10 ENCOUNTER — Other Ambulatory Visit (HOSPITAL_COMMUNITY): Payer: Self-pay

## 2024-03-02 DIAGNOSIS — H52203 Unspecified astigmatism, bilateral: Secondary | ICD-10-CM | POA: Diagnosis not present

## 2024-04-02 ENCOUNTER — Other Ambulatory Visit: Payer: Self-pay

## 2024-04-25 ENCOUNTER — Other Ambulatory Visit (HOSPITAL_COMMUNITY): Payer: Self-pay

## 2024-04-25 DIAGNOSIS — E119 Type 2 diabetes mellitus without complications: Secondary | ICD-10-CM | POA: Diagnosis not present

## 2024-04-25 DIAGNOSIS — E785 Hyperlipidemia, unspecified: Secondary | ICD-10-CM | POA: Diagnosis not present

## 2024-04-25 DIAGNOSIS — F39 Unspecified mood [affective] disorder: Secondary | ICD-10-CM | POA: Diagnosis not present

## 2024-04-25 DIAGNOSIS — Z6835 Body mass index (BMI) 35.0-35.9, adult: Secondary | ICD-10-CM | POA: Diagnosis not present

## 2024-04-25 DIAGNOSIS — I10 Essential (primary) hypertension: Secondary | ICD-10-CM | POA: Diagnosis not present

## 2024-04-25 MED ORDER — OZEMPIC (0.25 OR 0.5 MG/DOSE) 2 MG/3ML ~~LOC~~ SOPN
0.2500 mg | PEN_INJECTOR | SUBCUTANEOUS | 0 refills | Status: AC
  Filled 2024-04-25: qty 3, 30d supply, fill #0

## 2024-06-19 ENCOUNTER — Other Ambulatory Visit (HOSPITAL_COMMUNITY): Payer: Self-pay
